# Patient Record
Sex: Male | Born: 1960 | Race: White | Hispanic: No | Marital: Married | Smoking: Never smoker
Health system: Southern US, Academic
[De-identification: ages and names within clinical notes are randomized; demographics above are authoritative.]

## PROBLEM LIST (undated history)

## (undated) DIAGNOSIS — F32A Depression, unspecified: Secondary | ICD-10-CM

## (undated) DIAGNOSIS — F419 Anxiety disorder, unspecified: Secondary | ICD-10-CM

## (undated) DIAGNOSIS — R0609 Other forms of dyspnea: Secondary | ICD-10-CM

## (undated) DIAGNOSIS — I10 Essential (primary) hypertension: Secondary | ICD-10-CM

## (undated) DIAGNOSIS — I499 Cardiac arrhythmia, unspecified: Secondary | ICD-10-CM

## (undated) DIAGNOSIS — J45909 Unspecified asthma, uncomplicated: Secondary | ICD-10-CM

## (undated) DIAGNOSIS — E7800 Pure hypercholesterolemia, unspecified: Secondary | ICD-10-CM

## (undated) DIAGNOSIS — R002 Palpitations: Secondary | ICD-10-CM

## (undated) DIAGNOSIS — D869 Sarcoidosis, unspecified: Secondary | ICD-10-CM

## (undated) DIAGNOSIS — K219 Gastro-esophageal reflux disease without esophagitis: Secondary | ICD-10-CM

## (undated) DIAGNOSIS — K5190 Ulcerative colitis, unspecified, without complications: Secondary | ICD-10-CM

## (undated) DIAGNOSIS — E119 Type 2 diabetes mellitus without complications: Secondary | ICD-10-CM

## (undated) HISTORY — PX: HX VASECTOMY: SHX75

## (undated) HISTORY — PX: UMBILICAL HERNIA REPAIR: SHX196

## (undated) HISTORY — PX: HX KNEE ARTHROSCOPY: SHX127

## (undated) HISTORY — PX: HX HERNIA REPAIR: SHX51

## (undated) HISTORY — DX: Palpitations: R00.2

## (undated) HISTORY — DX: Cardiac arrhythmia, unspecified: I49.9

## (undated) HISTORY — DX: Other forms of dyspnea: R06.09

## (undated) HISTORY — PX: COLONOSCOPY: WVUENDOPRO10

---

## 1993-04-03 ENCOUNTER — Other Ambulatory Visit (HOSPITAL_COMMUNITY): Payer: Self-pay

## 2020-10-14 IMAGING — CT CT THORAX  W/O CONTRAST
2 of 4 series · 15 of 36 positions shown, 18 images · non-contrast
Comparison: No prior imaging studies are available for comparison of the chest.

﻿EXAM:  90321   CT THORAX  W/O CONTRAST
INDICATION: 60-year-old with symptoms of shortness of breath since having Covid infection in May 2020.  Diagnosis of sarcoidosis.  Smoking history not available.
TECHNIQUE: Multislice CT was performed without contrast and reviewed in multiple projections.  Exam was performed using one or more of the following dose reduction techniques: Automated exposure control, adjustment of the mA and/or kV according to patient size, or the use of iterative reconstruction technique. Radiation dose 679 mGy cm.

[ax · axial · 0.88mm/px · z∈[-754,-490]mm · 12 of 155 slices shown, 15 images]
[im 12/155  mediastinal]
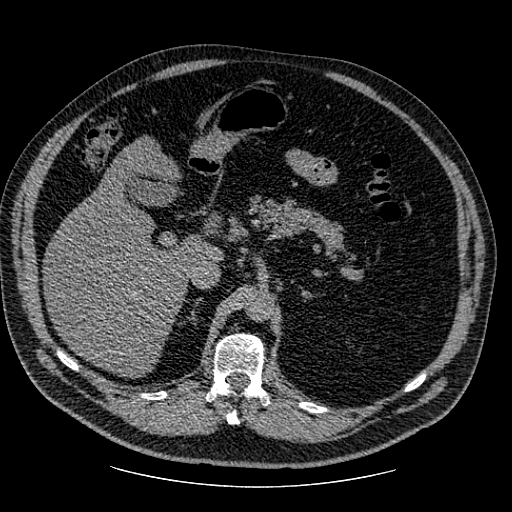
[im 12/155  lung]
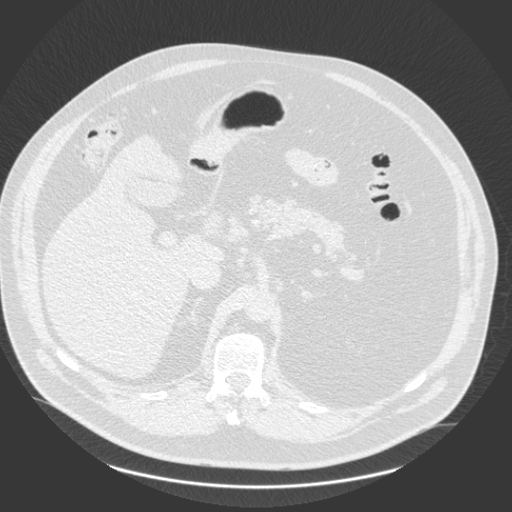
[im 23/155  lung]
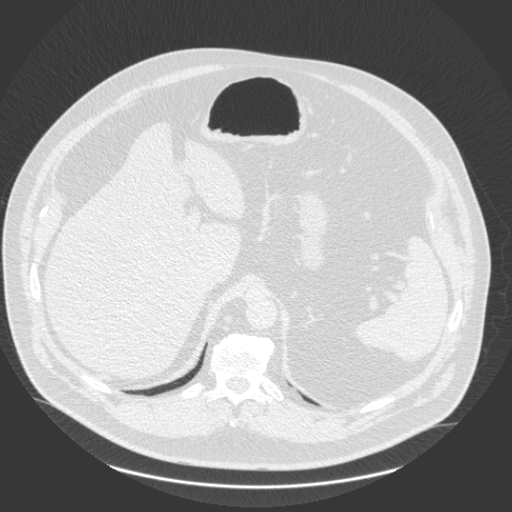
[im 34/155  lung]
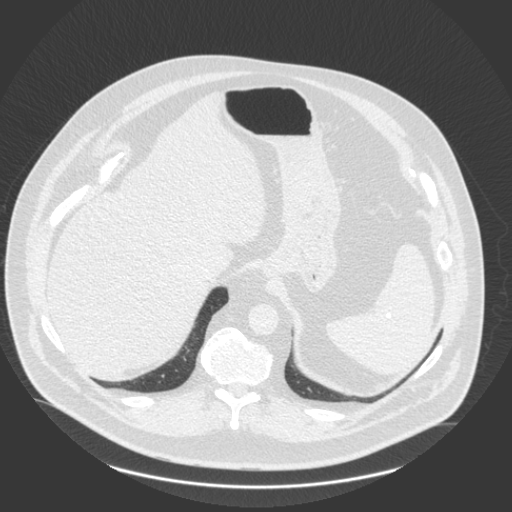
[im 45/155  lung]
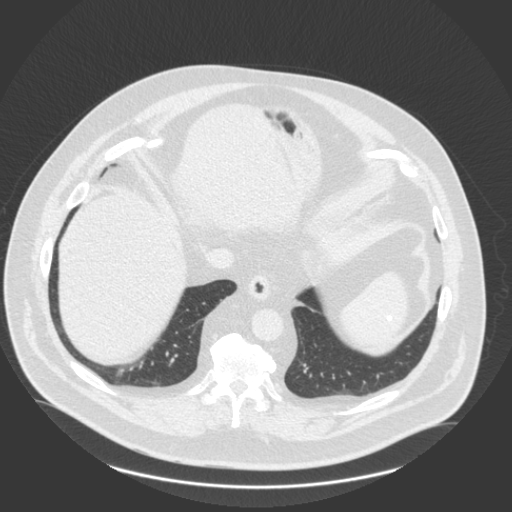
[im 56/155  mediastinal]
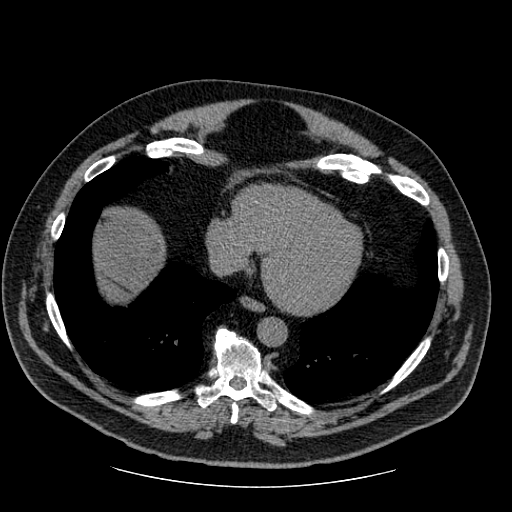
[im 56/155  lung]
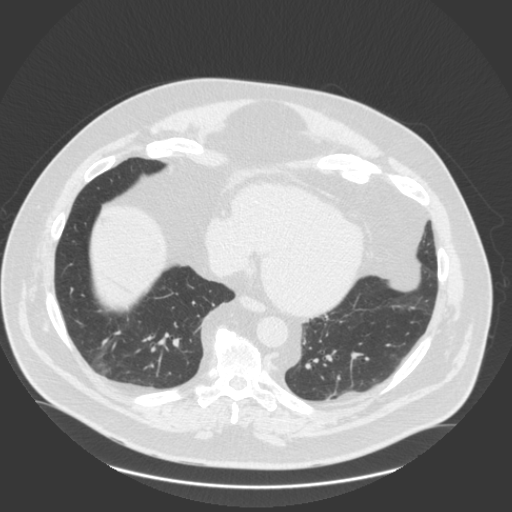
[im 67/155  lung]
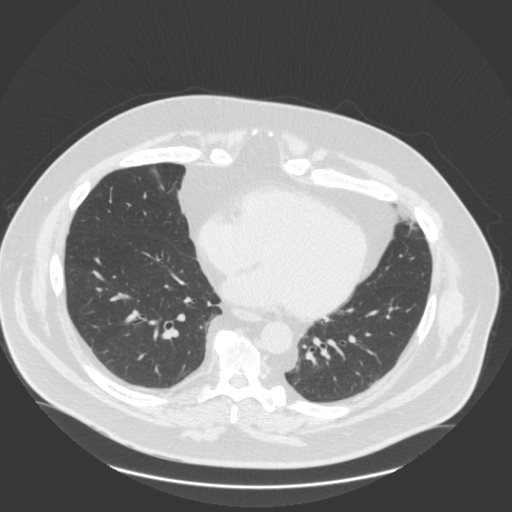
[im 89/155  lung]
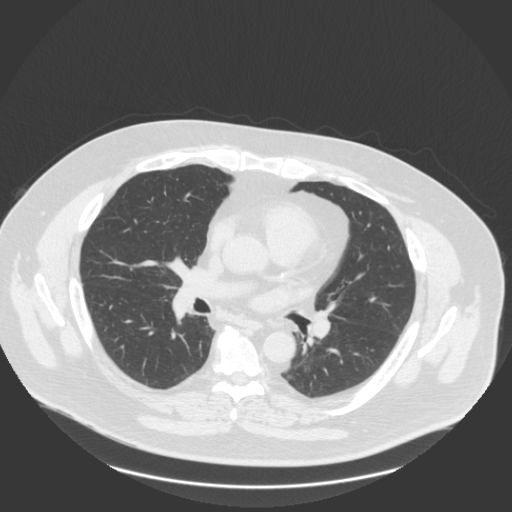
[im 100/155  lung]
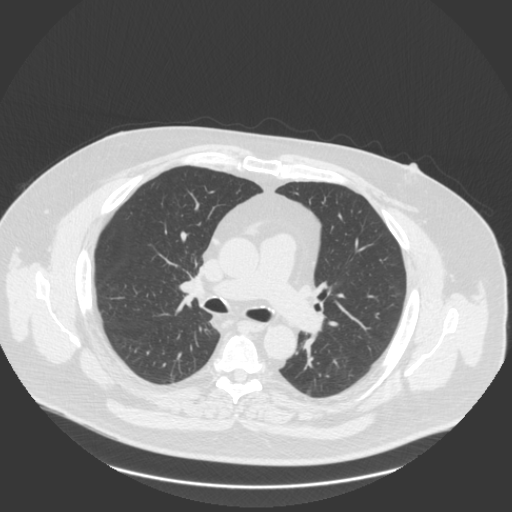
[im 111/155  mediastinal]
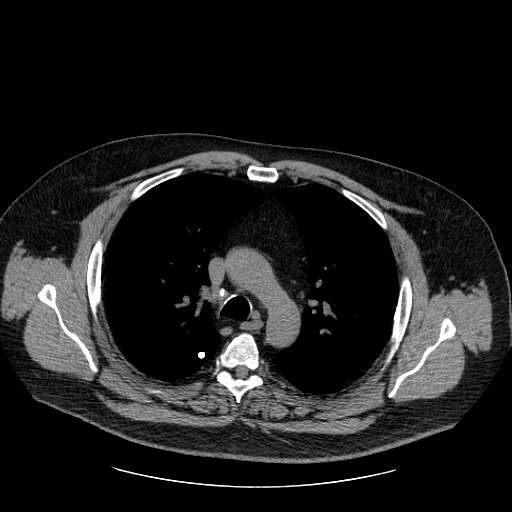
[im 111/155  lung]
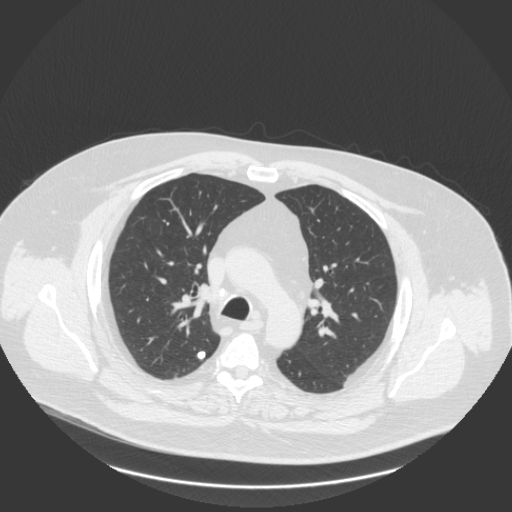
[im 122/155  lung]
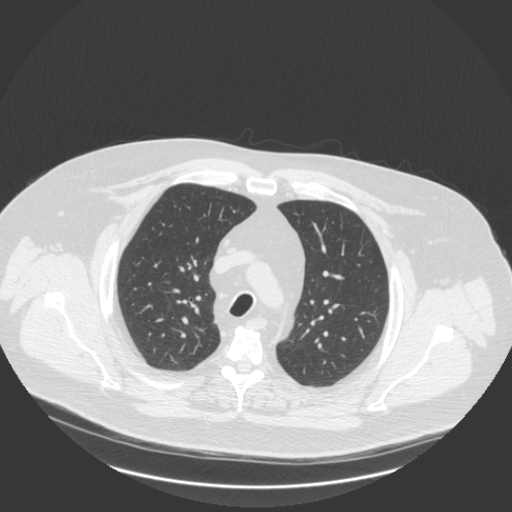
[im 133/155  lung]
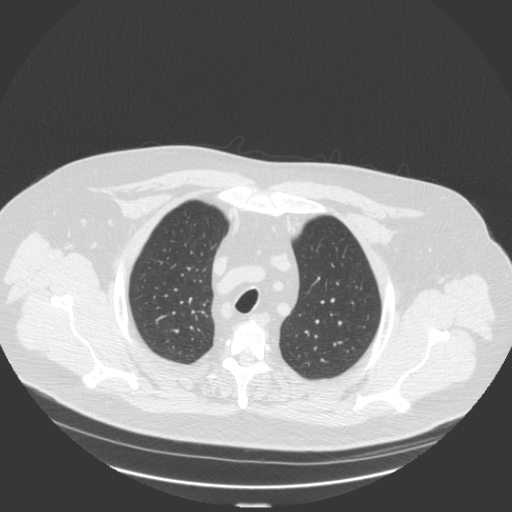
[im 144/155  lung]
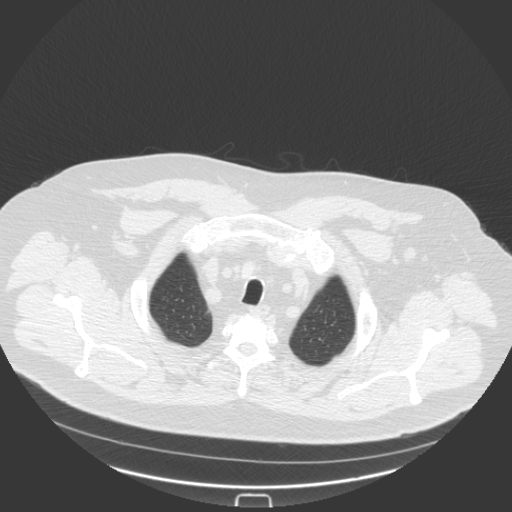

[cor · coronal · 0.95mm/px · 3 of 138 slices shown]
[im 28/138  lung]
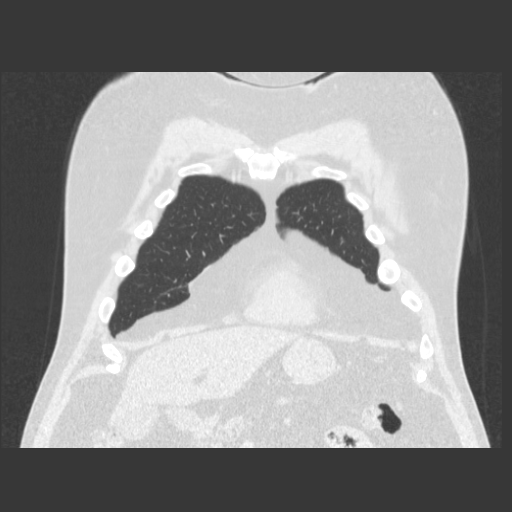
[im 55/138  lung]
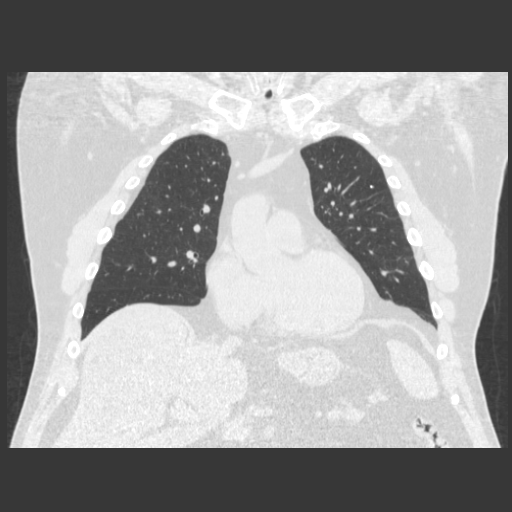
[im 83/138  lung]
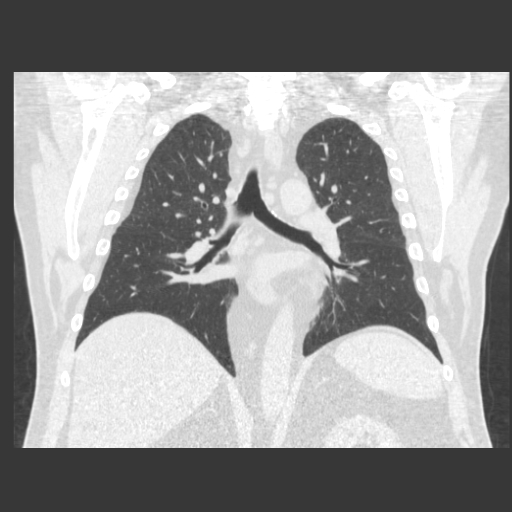

[15 of 36 positions shown; findings below may reference images not displayed]

FINDINGS: No focal lesions of supraclavicular fossa and axillae.  A 9 mm benign calcified granuloma in the right upper lobe posterior segment with calcified lymph nodes of right hilum and precarinal mediastinum.  No other pulmonary mass or nodule is seen.  No significant residual inflammatory changes or postinflammatory changes of the lung fields are seen.  Small benign granuloma is also noted in the left upper lobe.  

No pleural or pericardial effusion.  No focal lesions of thoracic spine, sternum and ribs.
IMPRESSION: 1. No significant postinflammatory interstitial changes of the lungs are seen. 

2. Benign calcified granulomatous lesions of both lungs with calcified lymph nodes of right hilum and precarinal mediastinum.  

3. No pleural or pericardial effusion.  Small hiatus hernia.

## 2021-06-06 IMAGING — MR MRI KNEE LT W/O CONTRAST
5 series · 35 of 40 positions shown · IV contrast (gadolinium)
Comparison: None available.

﻿EXAM:  [id number]   MRI KNEE LT W/O CONTRAST
INDICATION: Pain for several months following injury.
TECHNIQUE: Multiplanar multisequential MRI of the left knee joint was performed without gadolinium contrast.

[Series 5: PD fat-sat · axial · left · 4.0mm · 0.53mm/px · z∈[-50,+80]mm · 8 of 30 slices shown (1 of 3)]
[im 1/30]
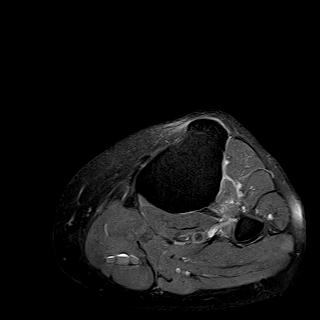
[im 5/30]
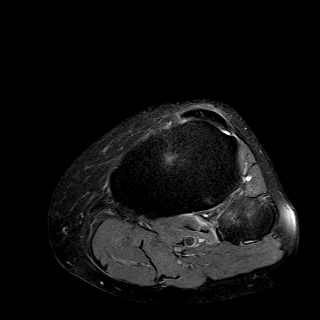
[im 9/30]
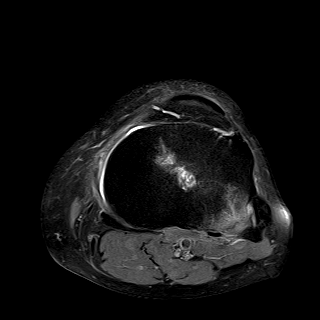
[im 13/30]
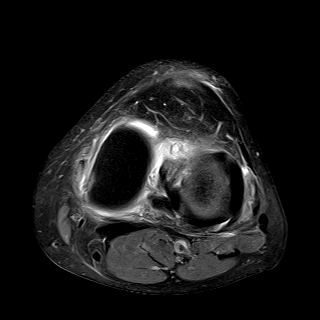
[im 17/30]
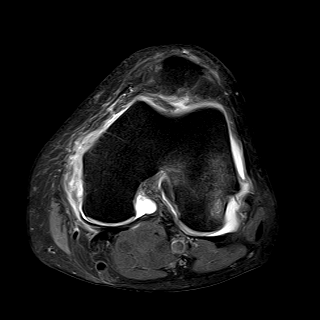
[im 21/30]
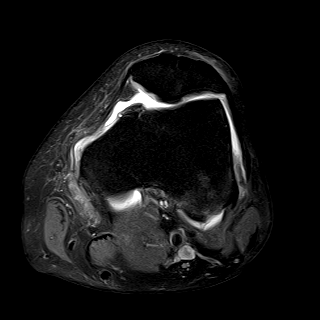
[im 25/30]
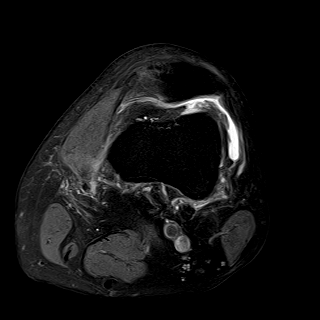
[im 30/30]
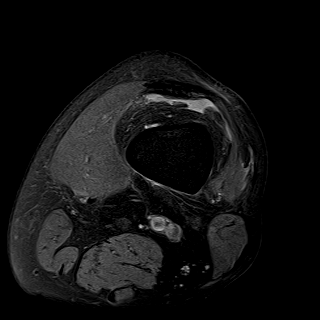

[Series 6: PD fat-sat · sagittal · left · 3.0mm · 0.47mm/px · 8 of 30 slices shown (2 of 3)]
[im 1/30]
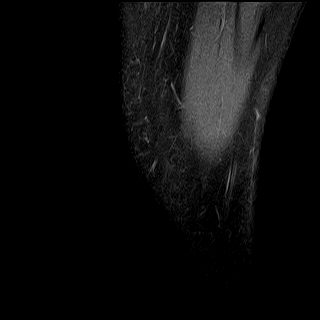
[im 5/30]
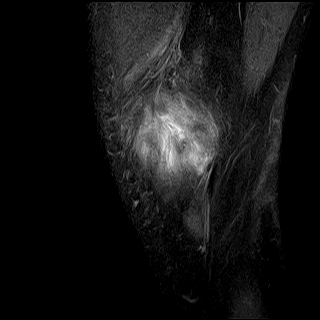
[im 9/30]
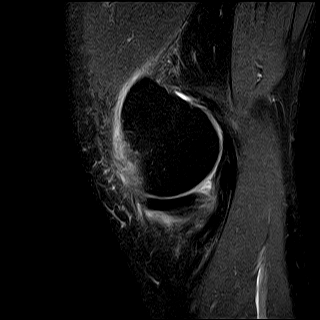
[im 13/30]
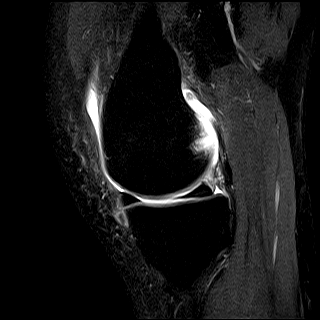
[im 17/30]
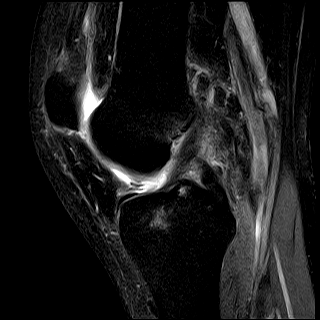
[im 21/30]
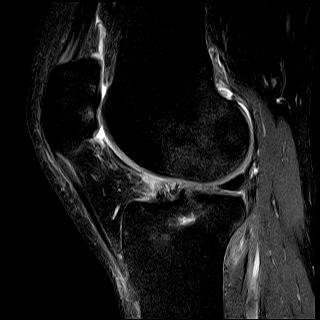
[im 25/30]
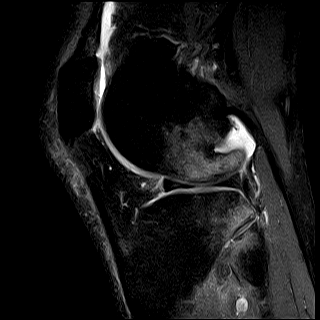
[im 30/30]
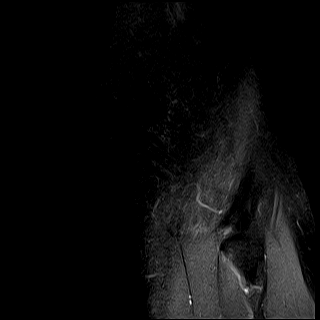

[Series 7: T1 · sagittal · left · 3.0mm · 0.29mm/px · 8 of 30 slices shown]
[im 1/30]
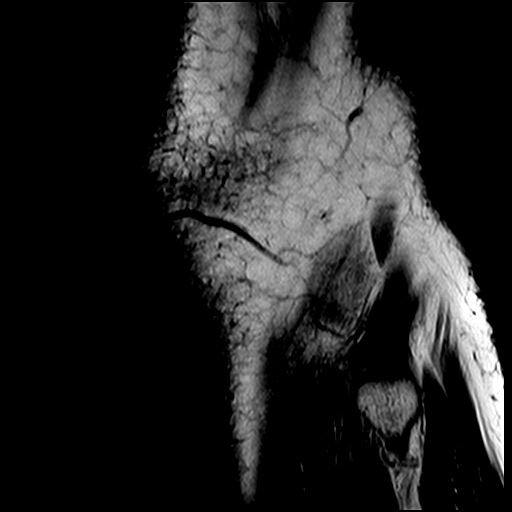
[im 5/30]
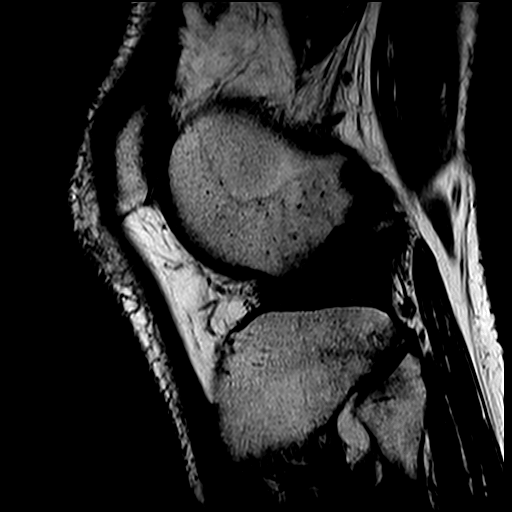
[im 9/30]
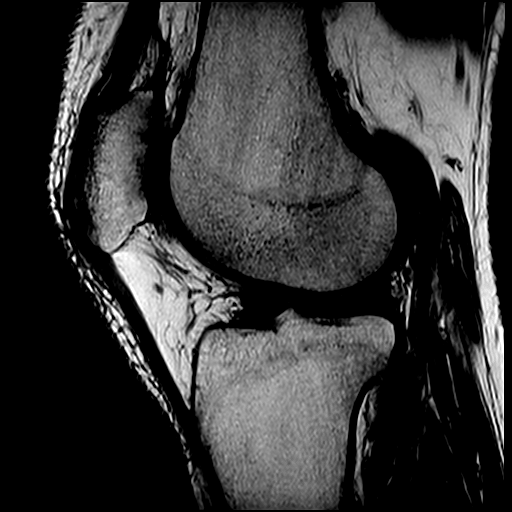
[im 13/30]
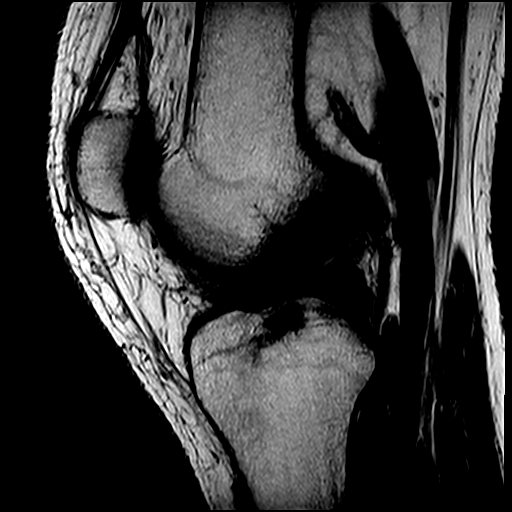
[im 17/30]
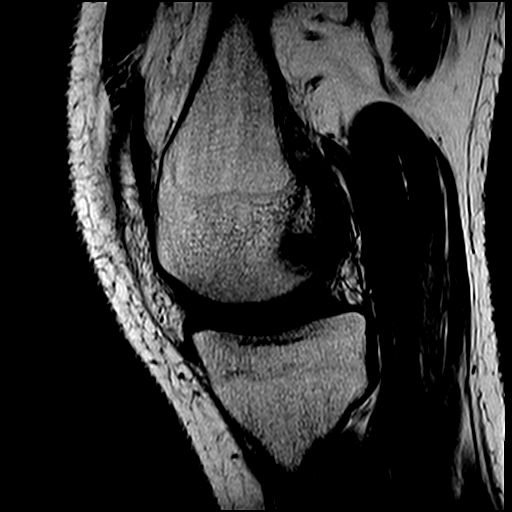
[im 21/30]
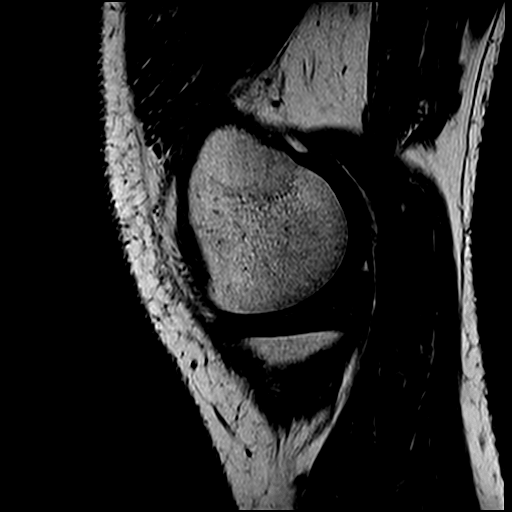
[im 25/30]
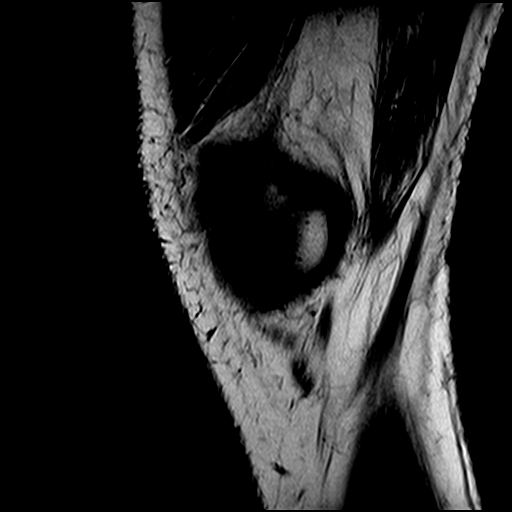
[im 30/30]
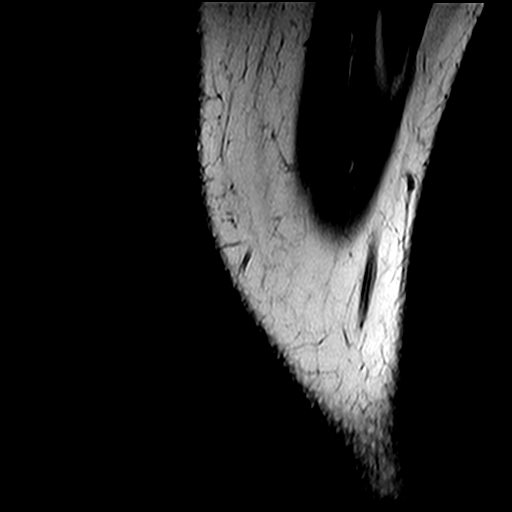

[Series 9: PD fat-sat · coronal · left · 3.5mm · 0.47mm/px · 8 of 27 slices shown (3 of 3)]
[im 1/27]
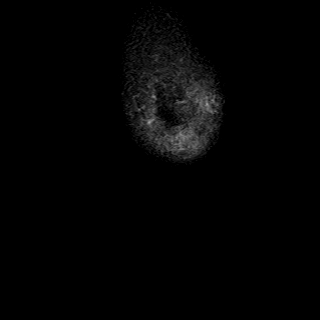
[im 4/27]
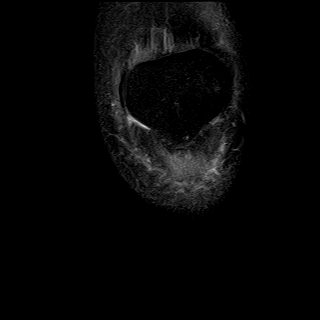
[im 8/27]
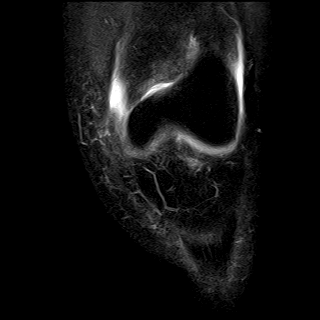
[im 12/27]
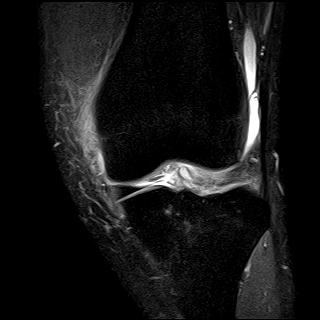
[im 15/27]
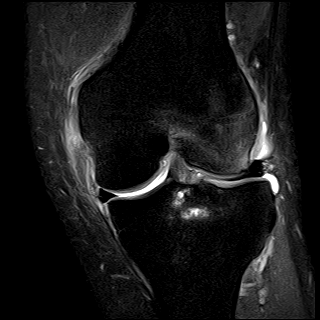
[im 19/27]
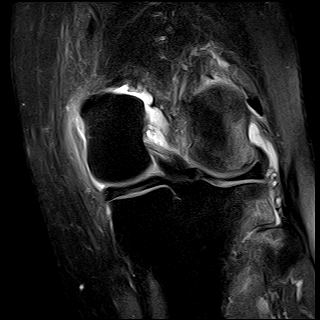
[im 23/27]
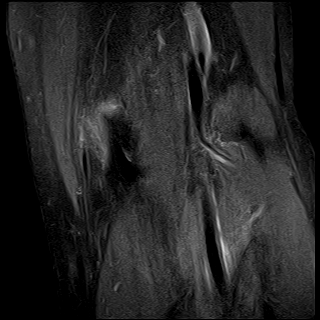
[im 27/27]
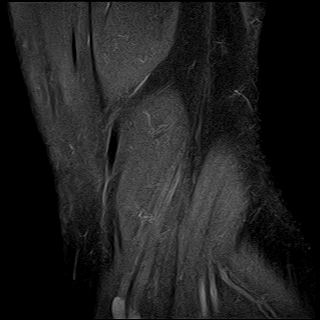

[Series 15: cor (id) · coronal · left · 3.5mm · 0.47mm/px · 3 of 27 slices shown]
[im 1/27]
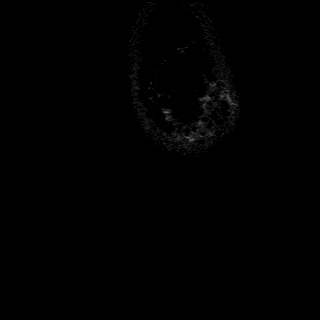
[im 4/27]
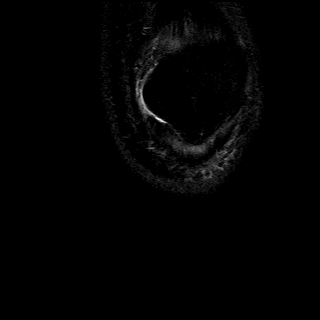
[im 8/27]
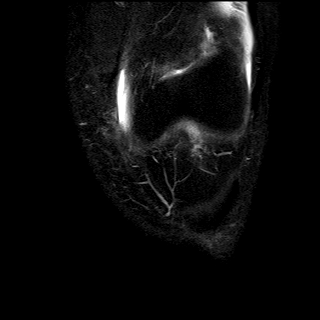

[35 of 40 positions shown; findings below may reference images not displayed]

FINDINGS: There is a full-thickness tear involving the proximal medial collateral ligament.  Menisci, cruciate and lateral collateral ligament complex appear intact.  There is moderate subcortical edema within the lateral femoral condyle, posterolateral tibial plateau and fibular head.  There is grade 3 chondromalacia of the medial tibiofemoral and patellofemoral articulations.  Mild subcortical cystic changes are also noted underlying the tibial spine.  Extensor mechanism is intact.  There is a small suprapatellar effusion. There is no Baker's cyst.
IMPRESSION: 1. Full-thickness tear involving the proximal medial collateral ligament. 

2. Moderate subcortical edema within the lateral femoral condyle, posterolateral tibial plateau and fibular head.  A posterolateral corner injury cannot be entirely excluded.  

3. Grade 3 chondromalacia of the medial tibiofemoral and patellofemoral articulations.

## 2021-07-05 ENCOUNTER — Encounter (INDEPENDENT_AMBULATORY_CARE_PROVIDER_SITE_OTHER): Payer: Self-pay | Admitting: NURSE PRACTITIONER

## 2021-10-05 ENCOUNTER — Other Ambulatory Visit (HOSPITAL_COMMUNITY): Payer: Self-pay | Admitting: Orthopaedic Surgery

## 2021-10-05 ENCOUNTER — Other Ambulatory Visit (HOSPITAL_COMMUNITY): Payer: 59

## 2021-10-05 ENCOUNTER — Other Ambulatory Visit: Payer: Self-pay

## 2021-10-05 ENCOUNTER — Inpatient Hospital Stay
Admission: RE | Admit: 2021-10-05 | Discharge: 2021-10-05 | Disposition: A | Discharge status: Home / Self Care | Payer: 59 | Source: Ambulatory Visit | Attending: Orthopaedic Surgery | Admitting: Orthopaedic Surgery

## 2021-10-05 DIAGNOSIS — Z01818 Encounter for other preprocedural examination: Secondary | ICD-10-CM

## 2021-10-05 LAB — BASIC METABOLIC PANEL
ANION GAP: 7 mmol/L — ABNORMAL LOW (ref 10–20)
BUN/CREA RATIO: 15 (ref 6–22)
BUN: 17 mg/dL (ref 7–25)
CALCIUM: 10.3 mg/dL (ref 8.6–10.3)
CHLORIDE: 105 mmol/L (ref 98–107)
CO2 TOTAL: 26 mmol/L (ref 21–31)
CREATININE: 1.15 mg/dL (ref 0.60–1.30)
ESTIMATED GFR: 72 mL/min/{1.73_m2} (ref >59)
GLUCOSE: 134 mg/dL — ABNORMAL HIGH (ref 74–109)
OSMOLALITY, CALCULATED: 279 mOsm/kg (ref 270–290)
POTASSIUM: 4.7 mmol/L (ref 3.5–5.1)
SODIUM: 138 mmol/L (ref 136–145)

## 2021-10-05 LAB — CBC
HCT: 44.3 % (ref 42.0–51.0)
HGB: 15.2 g/dL (ref 13.5–18.0)
MCH: 31.4 pg (ref 27.0–32.0)
MCHC: 34.3 g/dL (ref 32.0–36.0)
MCV: 91.5 fL (ref 78.0–99.0)
MPV: 7.9 fL (ref 7.4–10.4)
PLATELETS: 227 10*3/uL (ref 140–440)
RBC: 4.84 10*6/uL (ref 4.20–6.00)
RDW: 14.2 % (ref 11.6–14.8)
WBC: 10.2 10*3/uL (ref 4.0–10.5)
WBCS UNCORRECTED: 10.2 10*3/uL

## 2021-10-05 LAB — URINALYSIS, MACROSCOPIC
BILIRUBIN: NEGATIVE mg/dL
BLOOD: NEGATIVE mg/dL
GLUCOSE: 50 mg/dL — AB
KETONES: NEGATIVE mg/dL
LEUKOCYTES: NEGATIVE WBCs/uL
NITRITE: NEGATIVE
PH: 6 (ref 5.0–9.0)
PROTEIN: 50 mg/dL — AB
SPECIFIC GRAVITY: 1.023 (ref 1.002–1.030)
UROBILINOGEN: NORMAL mg/dL

## 2021-10-05 LAB — URINALYSIS, MICROSCOPIC

## 2021-10-06 LAB — ECG 12 LEAD
Atrial Rate: 67 {beats}/min
Calculated P Axis: 17 degrees
Calculated R Axis: 47 degrees
Calculated T Axis: 38 degrees
PR Interval: 146 ms
QRS Duration: 90 ms
QT Interval: 402 ms
QTC Calculation: 424 ms
Ventricular rate: 67 {beats}/min

## 2021-10-08 NOTE — H&P (Signed)
Scott Bradshaw  [id number]      CHIEF COMPLAINT  LEFT Knee pain; 08/07/21; GS / PJB    HISTORY  61 year old male suffered left knee injury on 05/16/2021. He states that bull pinned him up against a gate and started raking his head up and down him while doing so injured the left knee. patient has remained fairly active on the knee. Has not noted much improvement of the knee symptoms.He had corticosteroid injection with improved symptoms for approximately 1 week. Still has medial knee pain that is interfering with activities    PAST MEDICAL HISTORY  Cancer: Skin melanoma no known metastasis, Stage Removed Years Ago Treatment: By: C43.9: Malignant melanoma of skin, unspecified Cardiac: Hypertension and Hyperlipidemia DM type 2, W/ insulin use w/o complication DM Treatment: Monitoring, PCP, Trelegy 256mcg Tresiba 100 once daily. E11: Type 2 diabetes mellitus Z79.4: Long term (current) use of insulin GI history: GERD w/o esophagitis and Ulcerative colitis Treatment: N/A By: Ulcerative Coloitis resolved years ago. K51.9: Ulcerative colitis, unspecified Musculoskeletal: Denies OA, gout and other common musculoskeletal problems. Rheumatologic: Denies Depression, Major depression, single,ModerateTX:MonitoringTxby:PCPRobin Bradshaw PCP AnxietyRobin Bradshaw, PCPActiveDx:Mod:01/13/23ICD9: N/A Pulmonary: Asthma, COPD, Sarcoidosis and Sleep Apnea Sleep apnea treatment: CPAP    PAST SURGICAL HISTORY  Date Side/ Site Procedure Details Doctor Facility Complications [id number] Umbilical hernia surgery 05/07/13 Right Lower extremity-Knee 05/07/96 Right Lower extremity-Knee Knee arthroscopy Knee arthroscopy    ALLERGIES  Name Reactions Notes Penicillins Hives    MEDICATIONS  Name Ultracet 325-37.5 mg 30 One or two Oral every 6-8 hours as needed 2021-05-22 ------INTERACTIONS: Drug-drug interaction to Viibryd; Singulair 10 mg 1 One tab po once daily. 2021-05-19 ------Asthma Therapy - Leukotriene Receptor Antagonists Lipitor 20 mg 1 One tab po once  daily. 2021-05-19 ------Antihyperlipidemic - HMG CoA Reductase Inhibitors (statins) Glucophage 1,000 mg 1 One tab po BID. 2021-05-19 ------Insulin Response Enhancers - Biguanides K-Dur 20 mEq 1 One tab po once daily. 2021-05-19 ------Minerals & Electrolytes - Potassium, Oral Viibryd 40 mg 1 One tab po once daily. 2021-05-19 ------Antidepressant - SSRI & 5HT1A Partial Agonist Lasix 40 mg 1 One tab po once daily. 2021-05-19 ------Diuretic - Loop Tenormin 25 mg 1 One tab po once daily. 2021-05-19 ------Beta Blockers Cardiac Selective Claritin 10 mg 1 One tab po once daily. 2021-05-19 ------Antihistamines - 2nd Generation Vistaril 25 mg 1 One tab po once daily PRN 2021-05-19 ------Antianxiety Agent - Antihistamine Type Acuprin 81 mg 1 One tab po once daily. 2021-05-19 ------Platelet Aggregation Inhibitors - Salicylates ------INTERACTIONS: Drug-drug interaction to Viibryd; Klonopin 0.5 mg 1 One tab po once daily. PRN 2021-05-19 ------Anticonvulsant - Benzodiazepines D3 DOTS 2,000 unit 1 One tab po once daily. 2021-05-19 ------Vitamins - D Derivatives ProAir HFA 90 mcg/actuation 1 Two puff q4h PRN 2021-05-19 ------Asthma/COPD Therapy - Beta 2-Adrenergic Agents, Inhaled, Short Acting AccuNeb 1.25 mg/3 mL 1 One vial inhalation via nebulizer q4h PRN 2021-05-19 ------Asthma/COPD Therapy - Beta 2-Adrenergic Agents, Inhaled, Short Acting AndroGel 1.62 % (40.5 mg/2.5 1 Apply as directed two pumps a day BID 2021-05-19 ------Androgen - Single Agents 2 Strength QTY Sig Date  Action B-50 1 1 One tab po once daily. 2021-05-19 ------B-Complex Vitamin Combinations Nexium 40 mg 1 One tab po once daily. 2021-05-19 ------Gastric Acid Secretion Reducing Agents - Proton Pump Inhibitors (PPIs)    Family and Social History  Patient is Married. Non-smoker Recode: 4 Smokeless 1 can/day X years ETOH: <2oz/ week Lives with family Mother deceased age 12;Bone Cancer Father: age 69 No brothers; 1 sisters.; MI in father Cholesterol father  Other,  mother, Thyroid No known family history of Diabetes, Hypertension, Stroke, Bleeding, Blood clots, Neurologic conditions    REVIEW OF SYSTEMS  General: No history of change in weight, fever, chills, change in energy, fatigue or changes in sleep habits. Opthalmic: Negative for change in vision / eye problems. ENT: Denies congestion, sinus, throat and hearing issues. Respiratory: Denies SOB, cough and other respiratory symptoms. Cardiac: Denies chest pain, CHF and other cardiac symptoms. GI: No changes in bowel habits, abdominal pain or other GI complaint. Male GU: Denies significant male genitourinary symptoms. Musculoskeletal: Denies significant pain, joint or muscular symptoms. Neurological: Denies cephalgia, seizures, weakness, numbness and other neurologic symptoms. Endocrine: Denies polyuria, polydypsia and other endocrine related complaints. Diabetes, Hg A1C level: 8.9% which represents a mean glucose in the range of fair control with mean glucose in the range of 150-215. Scott Bradshaw, PCP Behavioral: Denies significant mood and psychological symptoms. Hemeatologic: Denies anemia, bleeding / clotting problems and other blood related complaints. Patient has history of diagnosed OSA RCRI, no risk factors, risk of cardiac death, nonfatal MI and cardiac arrest, 0.4%. Risk of MI, Pulmonary edema, ventricular fib, cardiac arrest and complete heart block 0.5% POUR / IPSS score 0. Score classified as mild level of symptoms, with no or mild increase in risk of urinary retention. ASA II : Mild systemic disease Charlson score: Enter [place] then select calculate score resulting in estimated in hospital mortality rate of 1.2%    EXAM  left knee no effusion mild quad atrophy pain medial joint line Murray's test positive compartment increased anterior drawer patellofemoral crepitation noted patellofemoral compression is negative range of motion is full; General exam   BMI   Cardiac, respiratory and mental status  NORMAL    X-RAYS  Date Name Review 06/06/2021 L Knee MR Full-thickness tear involving the proximal medial collateral ligament. moderate subcortical edema within the lateral femoral condyle, posterolateral ibial plateau and fibular head. a posterolateral corner injury cannot be entirley excluded. Grade 3 chondromalacia of hte medial tibiofemoral and patellaformoral articulatins. Review of outside study &   report  05/19/2021 Bil KN AP L Lat Stand 2VL Left Knee Primary arthritis, KL stage 2- mild. Compartments: lateral. Minimal lateral joint space narrowing.    Other Treatments reviewed  Date Name Review  ASSESSMENT  left knee MCL sprain bone edema lateral femoral condyle lateral tibial plateau medial meniscal tear ACL injury    Diagnosis  [place] Diagnosis C43.9: Malignant melanoma of skin, unspecified E11: Type 2 diabetes mellitus Z79.4: Long term (current) use of insulin K51.9: Ulcerative colitis, unspecified F32.1: Major depressive disorder, single episode, moderate  left knee MCL sprain bone edema lateral femoral condyle lateral tibial plateau medial meniscal tear ACL injury    RECOMMENDATION  Treatment: we again today reviewed MRI of the knee which shows medial meniscal tear some abnormality of the ACL as well as some osteoarthritis patellofemoral joint medial compartment. This point its been 3 months since the injury still having medial knee pain interfering with activities since had failure of corticosteroid injection. We discussed the role of arthroscopy. At this point his bone edema should have resolved. Probably has ACL tear. He does have medial meniscus tear as well as some early arthritis of the knee. It was made clear that arthroscopic surgery would not i mprove symptoms associated with osteoarthritis. At 61 years of age would not recommend doing ACL reconstruction. We discussed procedure discussed risk factors neurovascular compromise and infection. Surgical informed consent was obtained    Attestation:  Patient has been examined before anesthesia, and there are no changes in medical status or indications for surgery.      Scott Bradshaw is a 61 y.o. male brought by   Orders:   Date Name Review 06/06/2021 L Knee MR Full-thickness tear involving the proximal medial collateral ligament. moderate subcortical edema within the lateral femoral condyle, posterolateral ibial plateau and fibular head. a posterolateral corner injury cannot be entirley excluded. Grade 3 chondromalacia of hte medial tibiofemoral and patellaformoral articulatins. Review of outside study & report 05/19/2021 Bil KN AP L Lat Stand 2VL Left Knee Primary arthritis, KL stage 2- mild. Compartments: lateral. Minimal lateral joint space narrowing.  Date Name Review 05/19/2021 Left knee brace N/A    RETURN / FOLLOW-UP  proceed with scheduling arthroscopy    Karsten Ro, MD\ Talbert Forest, PA-C

## 2021-10-09 ENCOUNTER — Encounter (HOSPITAL_COMMUNITY): Payer: Self-pay | Admitting: Orthopaedic Surgery

## 2021-10-09 ENCOUNTER — Ambulatory Visit (HOSPITAL_COMMUNITY): Payer: 59

## 2021-10-09 ENCOUNTER — Encounter (HOSPITAL_COMMUNITY)
Admission: RE | Disposition: A | Discharge status: Home / Self Care | Payer: Self-pay | Source: Ambulatory Visit | Attending: Orthopaedic Surgery

## 2021-10-09 ENCOUNTER — Encounter (HOSPITAL_COMMUNITY): Payer: 59 | Admitting: Orthopaedic Surgery

## 2021-10-09 ENCOUNTER — Other Ambulatory Visit: Payer: Self-pay

## 2021-10-09 ENCOUNTER — Inpatient Hospital Stay
Admission: RE | Admit: 2021-10-09 | Discharge: 2021-10-09 | Disposition: A | Discharge status: Home / Self Care | Payer: 59 | Source: Ambulatory Visit | Attending: Orthopaedic Surgery | Admitting: Orthopaedic Surgery

## 2021-10-09 DIAGNOSIS — S83282A Other tear of lateral meniscus, current injury, left knee, initial encounter: Secondary | ICD-10-CM | POA: Insufficient documentation

## 2021-10-09 DIAGNOSIS — S83512A Sprain of anterior cruciate ligament of left knee, initial encounter: Secondary | ICD-10-CM | POA: Insufficient documentation

## 2021-10-09 DIAGNOSIS — S83242A Other tear of medial meniscus, current injury, left knee, initial encounter: Secondary | ICD-10-CM | POA: Insufficient documentation

## 2021-10-09 HISTORY — DX: Pure hypercholesterolemia, unspecified: E78.00

## 2021-10-09 HISTORY — DX: Unspecified asthma, uncomplicated: J45.909

## 2021-10-09 HISTORY — DX: Type 2 diabetes mellitus without complications (CMS HCC): E11.9

## 2021-10-09 HISTORY — DX: Gastro-esophageal reflux disease without esophagitis: K21.9

## 2021-10-09 HISTORY — DX: Sarcoidosis, unspecified: D86.9

## 2021-10-09 HISTORY — DX: Anxiety disorder, unspecified: F41.9

## 2021-10-09 HISTORY — DX: Essential (primary) hypertension: I10

## 2021-10-09 HISTORY — DX: Depression, unspecified: F32.A

## 2021-10-09 HISTORY — DX: Ulcerative colitis, unspecified, without complications (CMS HCC): K51.90

## 2021-10-09 LAB — POC BLOOD GLUCOSE (RESULTS)
GLUCOSE, POC: 124 mg/dl (ref 50–500)
GLUCOSE, POC: 140 mg/dl (ref 50–500)

## 2021-10-09 SURGERY — ARTHROSCOPY KNEE
Anesthesia: General | Site: Knee | Laterality: Left | Wound class: Clean Wound: Uninfected operative wounds in which no inflammation occurred

## 2021-10-09 MED ORDER — HYDROCODONE 5 MG-ACETAMINOPHEN 325 MG TABLET
1.0000 | ORAL_TABLET | ORAL | Status: DC | PRN
Start: 2021-10-09 — End: 2021-10-09
  Administered 2021-10-09: 1 via ORAL
  Filled 2021-10-09: qty 1

## 2021-10-09 MED ORDER — ALBUTEROL SULFATE HFA 90 MCG/ACTUATION AEROSOL INHALER
INHALATION_SPRAY | Freq: Once | RESPIRATORY_TRACT | Status: DC | PRN
Start: 2021-10-09 — End: 2021-10-09
  Administered 2021-10-09 (×2): 4 via RESPIRATORY_TRACT

## 2021-10-09 MED ORDER — FENTANYL (PF) 50 MCG/ML INJECTION WRAPPER
50.0000 ug | INJECTION | INTRAMUSCULAR | Status: DC | PRN
Start: 2021-10-09 — End: 2021-10-09
  Administered 2021-10-09 (×2): 50 ug via INTRAVENOUS

## 2021-10-09 MED ORDER — SODIUM CHLORIDE 0.9 % INTRAVENOUS PIGGYBACK
INJECTION | INTRAVENOUS | Status: AC
Start: 2021-10-09 — End: 2021-10-09
  Filled 2021-10-09: qty 100

## 2021-10-09 MED ORDER — HYDROCODONE 5 MG-ACETAMINOPHEN 325 MG TABLET
1.00 | ORAL_TABLET | ORAL | 0 refills | Status: AC | PRN
Start: 2021-10-09 — End: ?

## 2021-10-09 MED ORDER — ROPIVACAINE (PF) 2 MG/ML (0.2 %) INJECTION SOLUTION
INTRAMUSCULAR | Status: AC
Start: 2021-10-09 — End: 2021-10-09
  Filled 2021-10-09: qty 10

## 2021-10-09 MED ORDER — SODIUM CHLORIDE 0.9 % (FLUSH) INJECTION SYRINGE
3.0000 mL | INJECTION | Freq: Three times a day (TID) | INTRAMUSCULAR | Status: DC
Start: 2021-10-09 — End: 2021-10-09

## 2021-10-09 MED ORDER — LACTATED RINGERS INTRAVENOUS SOLUTION
INTRAVENOUS | Status: DC
Start: 2021-10-09 — End: 2021-10-09

## 2021-10-09 MED ORDER — MORPHINE 10 MG/ML INJECTION WRAPPER
INTRAVENOUS | Status: AC
Start: 2021-10-09 — End: 2021-10-09
  Filled 2021-10-09: qty 1

## 2021-10-09 MED ORDER — MIDAZOLAM 5 MG/ML INJECTION WRAPPER
INTRAMUSCULAR | Status: AC
Start: 2021-10-09 — End: 2021-10-09
  Filled 2021-10-09: qty 1

## 2021-10-09 MED ORDER — CEFAZOLIN 1 GRAM SOLUTION FOR INJECTION
INTRAMUSCULAR | Status: AC
Start: 2021-10-09 — End: 2021-10-09
  Filled 2021-10-09: qty 20

## 2021-10-09 MED ORDER — ONDANSETRON HCL (PF) 4 MG/2 ML INJECTION SOLUTION
4.0000 mg | Freq: Once | INTRAMUSCULAR | Status: DC | PRN
Start: 2021-10-09 — End: 2021-10-09

## 2021-10-09 MED ORDER — FENTANYL (PF) 50 MCG/ML INJECTION SOLUTION
INTRAMUSCULAR | Status: AC
Start: 2021-10-09 — End: 2021-10-09
  Filled 2021-10-09: qty 2

## 2021-10-09 MED ORDER — ALBUTEROL SULFATE 2.5 MG/3 ML (0.083 %) SOLUTION FOR NEBULIZATION
2.5000 mg | INHALATION_SOLUTION | Freq: Once | RESPIRATORY_TRACT | Status: DC | PRN
Start: 2021-10-09 — End: 2021-10-09

## 2021-10-09 MED ORDER — FENTANYL (PF) 50 MCG/ML INJECTION WRAPPER
25.0000 ug | INJECTION | INTRAMUSCULAR | Status: DC | PRN
Start: 2021-10-09 — End: 2021-10-09

## 2021-10-09 MED ORDER — MIDAZOLAM 5 MG/ML INJECTION WRAPPER
1.0000 mg | Freq: Once | INTRAMUSCULAR | Status: DC | PRN
Start: 2021-10-09 — End: 2021-10-09
  Administered 2021-10-09: 1 mg via INTRAVENOUS

## 2021-10-09 MED ORDER — PROCHLORPERAZINE EDISYLATE 10 MG/2 ML (5 MG/ML) INJECTION SOLUTION
5.0000 mg | Freq: Once | INTRAMUSCULAR | Status: DC | PRN
Start: 2021-10-09 — End: 2021-10-09

## 2021-10-09 MED ORDER — FAMOTIDINE (PF) 20 MG/2 ML INTRAVENOUS SOLUTION
20.0000 mg | Freq: Once | INTRAVENOUS | Status: AC
Start: 2021-10-09 — End: 2021-10-09
  Administered 2021-10-09: 20 mg via INTRAVENOUS

## 2021-10-09 MED ORDER — CEFAZOLIN 1 GRAM SOLUTION FOR INJECTION
1.0000 g | Freq: Once | INTRAMUSCULAR | Status: DC
Start: 2021-10-09 — End: 2021-10-09

## 2021-10-09 MED ORDER — FENTANYL (PF) 50 MCG/ML INJECTION WRAPPER
INJECTION | Freq: Once | INTRAMUSCULAR | Status: DC | PRN
Start: 2021-10-09 — End: 2021-10-09
  Administered 2021-10-09 (×2): 50 ug via INTRAVENOUS

## 2021-10-09 MED ORDER — ONDANSETRON HCL (PF) 4 MG/2 ML INJECTION SOLUTION
4.0000 mg | Freq: Once | INTRAMUSCULAR | Status: AC
Start: 2021-10-09 — End: 2021-10-09
  Administered 2021-10-09: 4 mg via INTRAVENOUS

## 2021-10-09 MED ORDER — PROPOFOL 10 MG/ML IV BOLUS
INJECTION | Freq: Once | INTRAVENOUS | Status: DC | PRN
Start: 2021-10-09 — End: 2021-10-09
  Administered 2021-10-09: 50 mg via INTRAVENOUS
  Administered 2021-10-09: 150 mg via INTRAVENOUS

## 2021-10-09 MED ORDER — SUGAMMADEX 100 MG/ML INTRAVENOUS SOLUTION
Freq: Once | INTRAVENOUS | Status: DC | PRN
Start: 2021-10-09 — End: 2021-10-09
  Administered 2021-10-09: 400 mg via INTRAVENOUS

## 2021-10-09 MED ORDER — LIDOCAINE (PF) 100 MG/5 ML (2 %) INTRAVENOUS SYRINGE
INJECTION | Freq: Once | INTRAVENOUS | Status: DC | PRN
Start: 2021-10-09 — End: 2021-10-09
  Administered 2021-10-09: 100 mg via INTRAVENOUS

## 2021-10-09 MED ORDER — FAMOTIDINE (PF) 20 MG/2 ML INTRAVENOUS SOLUTION
INTRAVENOUS | Status: AC
Start: 2021-10-09 — End: 2021-10-09
  Filled 2021-10-09: qty 2

## 2021-10-09 MED ORDER — ONDANSETRON HCL (PF) 4 MG/2 ML INJECTION SOLUTION
INTRAMUSCULAR | Status: AC
Start: 2021-10-09 — End: 2021-10-09
  Filled 2021-10-09: qty 2

## 2021-10-09 MED ORDER — IPRATROPIUM 0.5 MG-ALBUTEROL 3 MG (2.5 MG BASE)/3 ML NEBULIZATION SOLN
3.0000 mL | INHALATION_SOLUTION | Freq: Once | RESPIRATORY_TRACT | Status: DC | PRN
Start: 2021-10-09 — End: 2021-10-09

## 2021-10-09 MED ORDER — LACTATED RINGERS INTRAVENOUS SOLUTION
INTRAVENOUS | Status: DC
Start: 2021-10-09 — End: 2021-10-09
  Administered 2021-10-09: 0 via INTRAVENOUS

## 2021-10-09 MED ORDER — SODIUM CHLORIDE 0.9 % (FLUSH) INJECTION SYRINGE
3.0000 mL | INJECTION | INTRAMUSCULAR | Status: DC | PRN
Start: 2021-10-09 — End: 2021-10-09

## 2021-10-09 MED ORDER — SUCCINYLCHOLINE 20 MG/ML INTRAVENOUS WRAPPER
INJECTION | Freq: Once | INTRAVENOUS | Status: DC | PRN
Start: 2021-10-09 — End: 2021-10-09
  Administered 2021-10-09: 160 mg via INTRAVENOUS

## 2021-10-09 MED ORDER — ROCURONIUM 10 MG/ML INTRAVENOUS SYRINGE WRAPPER
INJECTION | Freq: Once | INTRAVENOUS | Status: DC | PRN
Start: 2021-10-09 — End: 2021-10-09
  Administered 2021-10-09: 10 mg via INTRAVENOUS
  Administered 2021-10-09: 40 mg via INTRAVENOUS

## 2021-10-09 SURGICAL SUPPLY — 62 items
BAG BIOHAZ RD 30X24IN THK3 MIL C8-10GL LLDPE INFCT WASTE CAN (MED SURG SUPPLIES) ×2
BAG BIOHAZ RD 30X24IN THK3 MIL C8-10GL LLDPE INFCT WASTE CAN PNCT RST (MED SURG SUPPLIES) ×2
BAG BIOHAZ RD 43X30IN THK3 MIL C20-30GL LLDPE INFCT WASTE (MED SURG SUPPLIES) ×1
BAG BIOHAZ RD 43X30IN THK3 MIL C20-30GL LLDPE INFCT WASTE CAN PNCT RST (MED SURG SUPPLIES) ×1 IMPLANT
BANDAGE ELT 5.8YDX4IN NONST SLFCLS ELAS KNIT TEAL END STCH (WOUND CARE/ENTEROSTOMAL SUPPLY) ×1
BANDAGE ELT 5.8YDX4IN NONST SLFCLS ELAS KNIT TEAL END STCH VELCRO COTTON POLY BLND STD LGTH COMPRESS (WOUND CARE SUPPLY) ×1 IMPLANT
BANDAGE ESMARK 9FTX6IN STRL SYN COMPRESS LF (WOUND CARE SUPPLY) ×1 IMPLANT
BANDAGE ESMARK 9FTX6IN STRL SY_N COMPRESS LF (WOUND CARE/ENTEROSTOMAL SUPPLY) ×1
BLADE 11 2 END CBNSTL SURG STRL DISP (CUTTING ELEMENTS) ×1
BLADE 11 2 END CBNSTL SURG STRL DISP (SURGICAL CUTTING SUPPLIES) ×1 IMPLANT
BLADE SAW 24.5X9MM SGTL SS THK.64MM XSH NRW THN STRL LF (CUTTING ELEMENTS) ×1
BLADE SAW 24.5X9MM SGTL SS THK.64MM XSH NRW THN STRL LF (SURGICAL CUTTING SUPPLIES) ×1 IMPLANT
BLADE SHAVER 13CM 4MM COOLCUT 2 CUT POWER SFT TISS RESCT STRL DISP (ENDOSCOPIC SUPPLIES) ×1 IMPLANT
BLADE SHAVER 13CM 4MM COOLCUT_2 CUT PWR SFT TISS RESCT STRL (INSTRUMENTS ENDOMECHANICAL) ×1
CLEANER INSTR PREPZYME MUL-TRD CONTAINR NARSL NEUT PH BDGR (MISCELLANEOUS PT CARE ITEMS) ×1
CONV USE ITEM 321852 - GLOVE SURG 6.5 LF  PLISPRN (GLOVES AND ACCESSORIES) ×1 IMPLANT
CONV USE ITEM 321983 - GLOVE SURG 8 LTX CHEMO PF SMOOTH BEAD CUF STRL WHT 11.6IN PLMR THK.2MM THK.21MM (GLOVES AND ACCESSORIES) ×1 IMPLANT
CONV USE ITEM 322852 - ROLL ECODRI-SAFE ABS POLY BCK ORTHO (ORTHOPEDICS (NOT IMPLANTS)) ×1 IMPLANT
CONV USE ITEM 329146 - CLEANER INSTR PREPZYME MUL-TRD CONTAINR NARSL NEUT PH BDGR 22OZ (MISCELLANEOUS PT CARE ITEMS) ×1 IMPLANT
CONV USE ITEM 338662 - PACK SURG ASCP STRL DISP ~~LOC~~ BPT MED CNTR LF (CUSTOM TRAYS & PACK) ×1 IMPLANT
CONV USE ITEM 81225 - BAG BIOHAZ RD 30X24IN THK3 MIL C8-10GL LLDPE INFCT WASTE CAN PNCT RST (MED SURG SUPPLIES) ×2 IMPLANT
COUNTER 20 CNT BLOCK ADH NEEDLE STRL LF  RD SHARP FOAM 15.75X11.5X14IN DISP (MED SURG SUPPLIES) ×1 IMPLANT
COUNTER 20 CNT BLOCK ADH NEEDLE STRL LF RD SHARP FOAM 15.75 (MED SURG SUPPLIES) ×1
COVER 53X24IN MAYOSTAND PRXM STRL DISP EQP SMS LF (DRAPE/PACKS/SHEETS/OR TOWEL) ×1 IMPLANT
COVER TBL 90X50IN STD SMS REINF FNFLD STRL LF  DISP (DRAPE/PACKS/SHEETS/OR TOWEL) ×2 IMPLANT
COVER TBL 90X50IN STD SMS REINF FNFLD STRL LF DISP (DRAPE/PACKS/SHEETS/OR TOWEL) ×2
DEVICE TIGHTROPE 10MM 4 PNTLK_ADJ LOOP DRILL H BONE TEND ×1 IMPLANT
DISC USE 162338 - NEEDLE HYPO 18GA 1.5IN TW BD_POLYPROP REG BVL LL HUB (MED SURG SUPPLIES) ×1
DISCONTINUED USE 31829 - NEEDLE HYPO 18GA 1.5IN TW BD_POLYPROP REG BVL LL HUB (MED SURG SUPPLIES) ×1 IMPLANT
DRAPE ASCP ABS REINF FENESTRATE FL CNTRL PCH 121X90IN EXTREMITY STD LF  STRL DISP SURG SMS 37X29IN (DRAPE/PACKS/SHEETS/OR TOWEL) ×1 IMPLANT
DRAPE ASCP ABS REINF FENESTRAT_E FL CNTRL PCH 121X90IN (DRAPE/PACKS/SHEETS/OR TOWEL) ×1
DRAPE MAYOSTAND CVR 53X24IN PR_XM LF STRL DISP EQP SMS (DRAPE/PACKS/SHEETS/OR TOWEL) ×1
DURAPREP 26ML 8630 CS/20 (MED SURG SUPPLIES) ×1
GLOVE SURG 6.5 LF PF SMOOTH STRL WHT PLISPRN (GLOVES AND ACCESSORIES) ×1
GLOVE SURG 8 LTX PF SMOOTH STRL CRM (GLOVES AND ACCESSORIES) ×1
GOWN SURG XL XLNG L4 REINF HKLP CLSR SET IN SLEEVE STRL LF (DRAPE/PACKS/SHEETS/OR TOWEL) ×3
GOWN SURG XL XLNG L4 REINF HKLP CLSR SET IN SLEEVE STRL LF  DISP BLU SIRUS SMS PE 56IN (DRAPE/PACKS/SHEETS/OR TOWEL) ×3
GRAFT SFT TISS 10MM FLEXIGRAFT DWL PATEL LGMNT TEND (IMPLANTS GRAFT/TISSUE) ×1
HDPE THK22 UM C40-45 GL L48 IN X W40 IN NATURAL (MISCELLANEOUS PT CARE ITEMS) ×2 IMPLANT
NEEDLE SPINAL PNK 3.5IN 18GA QUINCKE REG WL POLYPROP QUINCKE TIP STRL LF  DISP (MED SURG SUPPLIES) ×1 IMPLANT
NEEDLE SPINAL PNK 3.5IN 18GA Q_UINCKE STD LGTH BVL FIT STY (MED SURG SUPPLIES) ×1
PACK ARTHROGRAM MDLN INDUSTRIES INC. RAD GEN N/M S DISP (CUSTOM TRAYS & PACK) ×1
PACK SURG ASCP STRL DISP ~~LOC~~ BPT MED CNTR LF (CUSTOM TRAYS & PACK) ×1
PMP TUBING 13FT CONT WV III DU_ALWAVE ARTHRO STRL DISP (MED SURG SUPPLIES) ×1
PUMP TUBING 13FT CONT WV III DUALWAVE ARTHRO STRL DISP (MED SURG SUPPLIES) ×1 IMPLANT
ROLL ECODRI-SAFE ABS POLY BCK ORTHO (ORTHOPEDICS (NOT IMPLANTS)) ×1
SOL IRRG LR 3L PRSV N-PYRG FLXB CONTAINR STRL LF (MEDICATIONS/SOLUTIONS) ×4 IMPLANT
SOL SURG PREP 26ML DRPRP 74% ISPRP 0.7% IOD POVACRYLEX SLF CNTN APPL SKIN STRL PREOP (MED SURG SUPPLIES) ×1 IMPLANT
SPONGE GAUZE 4X4IN MDCHC COTTON 12 PLY TY 7 LF  STRL DISP (WOUND CARE SUPPLY) ×1 IMPLANT
SPONGE GAUZE 4X4IN MDCHC COTTO_N 12 PLY TY 7 LF STRL DISP (WOUND CARE/ENTEROSTOMAL SUPPLY) ×1
SUTURE 4-0 C-14 SURGIPRO2 18IN BLU MONOF NONAB (SUTURE/WOUND CLOSURE) ×2 IMPLANT
SYRINGE LL 10ML LF  STRL GRAD N-PYRG DEHP-FR PVC FREE MED DISP (MED SURG SUPPLIES) ×1 IMPLANT
SYRINGE LL 10ML LF STRL MED D_ISP (MED SURG SUPPLIES) ×1
TOWEL 24X16IN COTTON BLU DISP SURG STRL LF (DRAPE/PACKS/SHEETS/OR TOWEL) ×4 IMPLANT
TUBE BUBBLE CONNECTING_8888280214 1EA/BX/CS (MED SURG SUPPLIES) ×2
TUBING CONN 6MM X 3.7M (MED SURG SUPPLIES) ×2
TUBING EXT PIECE AR-6220 BX/20 (INSTRUMENTS ENDOMECHANICAL) ×1
TUBING IRRG 6FT DUALWAVE BKFL CK VALVE EXT STRL DISP (ENDOSCOPIC SUPPLIES) ×1 IMPLANT
TUBING SUCT CLR 100FT 3/16IN ARGYLE UNIV PVC NCDTV BBL NONST LF (MED SURG SUPPLIES) ×2 IMPLANT
TUBING SUCT CLR 12FT .25IN ARGYLE PVC NCDTV STR MALE FEMALE MLD CONN STRL LF (MED SURG SUPPLIES) ×1 IMPLANT
TUBING SUCT CLR 6FT .25IN ARGYLE PVC NCDTV STR MALE FEMALE (MED SURG SUPPLIES) ×1
TUBING SUCT CLR 6FT .25IN ARGYLE PVC NCDTV STR MALE FEMALE MLD CONN STRL LF (MED SURG SUPPLIES) ×1 IMPLANT

## 2021-10-09 NOTE — Anesthesia Transfer of Care (Signed)
ANESTHESIA TRANSFER OF CARE   Scott Bradshaw is a 61 y.o. ,male, Weight: 104 kg (230 lb)   had Procedure(s):  LATERAL AND MEDIAL MENISECTOMY ; DEBRIDEMENT LEFT KNEE  performed  10/09/21   Primary Service: Karsten Ro, MD    Past Medical History:   Diagnosis Date   . Anxiety    . Asthma    . Depression    . Diabetes mellitus, type 2 (CMS HCC)    . Esophageal reflux    . Hypercholesterolemia    . Hypertension    . Sarcoidosis    . Ulcerative colitis (CMS [place])       Allergy History as of 10/09/21     PENICILLINS       Noted Status Severity Type Reaction    10/09/21 0746 Duffy, Lattie Haw, CPhT 01/31/02 Active Medium  Hives/ Urticaria              I completed my transfer of care / handoff to the receiving personnel during which we discussed:  Access, Airway, All key/critical aspects of case discussed, Antibiotics, Analgesia, Expectation of post procedure, Fluids/Product, Gave opportunity for questions and acknowledgement of understanding, Labs and PMHx      Post Location: PACU                                                           Last OR Temp: Temperature: 36.2 C (97.2 F)  ABG:  POTASSIUM   Date Value Ref Range Status   10/05/2021 4.7 3.5 - 5.1 mmol/L Final     KETONES   Date Value Ref Range Status   10/05/2021 Negative Negative, Trace mg/dL Final     CALCIUM   Date Value Ref Range Status   10/05/2021 10.3 8.6 - 10.3 mg/dL Final     Calculated P Axis   Date Value Ref Range Status   10/05/2021 17 degrees Final     Calculated R Axis   Date Value Ref Range Status   10/05/2021 47 degrees Final     Calculated T Axis   Date Value Ref Range Status   10/05/2021 38 degrees Final     Airway:* No LDAs found *  Blood pressure 130/85, pulse 68, temperature 36.2 C (97.2 F), resp. rate 16, height 1.829 m (6'), weight 104 kg (230 lb), SpO2 100 %.

## 2021-10-09 NOTE — Anesthesia Procedure Notes (Signed)
Scott Bradshaw    Airway Note  General Information and Staff   Authorizing provider: Almyra Free, CRNA  Performing provider: Almyra Free, CRNA        Urgency: elective    Airway not difficult    Indications and Patient Condition  Pt location: In Or  Indications for airway management: anesthesia  Spontaneous ventilation: present  Sedation level: deep  Preoxygenated: yes  Patient position: sniffing  Mask difficulty assessment: 2 - vent by mask + OA or adjuvant +/- NMBA        Final Airway Details  Final airway type: endotracheal airway        Successful airway: ETT and cuffed     Successful intubation technique: video laryngoscopy  Facilitating devices/methods: intubating stylet  Video scope brand: GlideScope     Bullard Type: Adult     ILMA Procedure :Standard blade  Endotracheal tube insertion site: oral  Blade size: #3  Airway size (mm): 7.5  Cormack-Lehane Classification: grade I - full view of glottis  Placement verified by: chest auscultation and capnometry   Marked at 24  Measured from: lips  Secured with: Tape  Number of attempts at approach: 1Airway complications: Atraumatic

## 2021-10-09 NOTE — OR PostOp (Signed)
1525: PT. REQUESTS TO BE DISCHARGED AT THIS TIME. O2 SAT HAS REMAINED ABOVE 97% SINCE ARRIVAL TO DAY SURGERY AND PT. USES His C-PAP DAILY. TELEPHONE ORDER RECEIVED FROM DR. ONGLATCO, PT. CAN BE DISCHARGED AT THIS TIME.

## 2021-10-09 NOTE — Anesthesia Postprocedure Evaluation (Signed)
Anesthesia Post Op Evaluation    Patient: Scott Bradshaw  Procedure(s):  LATERAL AND MEDIAL MENISECTOMY ; DEBRIDEMENT LEFT KNEE    Last Vitals:Temperature: 36.2 C (97.2 F) (10/09/21 1157)  Heart Rate: 68 (10/09/21 1157)  BP (Non-Invasive): 130/85 (10/09/21 1157)  Respiratory Rate: 16 (10/09/21 1155)  SpO2: 100 % (10/09/21 1157)    No notable events documented.      Patient location during evaluation: bedside       Patient participation: complete - patient participated  Level of consciousness: awake and alert    Pain management: satisfactory to patient  Airway patency: patent    Anesthetic complications: no  Cardiovascular status: hemodynamically stable  Respiratory status: acceptable  Hydration status: acceptable  Patient post-procedure temperature: Pt Normothermic   PONV Status: Absent

## 2021-10-09 NOTE — Discharge Instructions (Addendum)
Return to office on June 20th, at 9:00 AM for your follow up appointment.    Weight bearing as tolerated. Ambulate with periods of rest.     Call the doctor with any questions or concerns that you may have.     Script:  Hydrocodone 5mg /325 mg po every 4 hours as needed for pain.

## 2021-10-09 NOTE — OR Surgeon (Signed)
[place] [facility]  Operative Note     PATIENT NAME:  Scott Bradshaw, Scott Bradshaw  MRN:  [id number]  DOB:  [birth year]    Date of Procedure:  10/09/2021  Preoperative Diagnosis: ANTERIOR CRUCIATE LIGAMENT AND MEDIAL MENISCUS TEARS  LEFT KNEE   Postoperative Diagnoses:  MEDIAL AND LATERAL MENISCAL TEARS LEFT KNEE   Procedure Performed: Left knee partial medial and lateral menisectomy, debridement of joint.  Implants:* No implants in log *   Surgeon: Alexia Freestone, MD   Anesthesia: CRNA: Levi Aland, CRNA   OR Staff: Circulator: Octavio Graves, RN  PERIOPERATIVE CARE ASSISTANT: Avel Peace, PCA  Relief Scrub: Raymond, Olegario Messier, CST  Scrub Person: Dola Argyle, ST  Scrub First Assist: Almyra Free, ST  Anesthesia Tech: Hoy Finlay, AA   Anticoagulation: NA    Antibiotics:    Estimated Blood Loss: * No blood loss amount entered *   Specimens: * No specimens in log *   Complications: None immediate  Findings:  Medial meniscus:  Complex tear posterior 2/3   Inner 1/3 resected  Lateral meniscus: Radial tear junction ant 1/3, edges removed.    Articular Cartilage:    Site Outerbridge Grade Size and Notes   Patella: 3 Central and lateral   Trochlea:   1 central   Medial femur 2-3  Posterolateral 1/3   Medial Tibia 2 Mid 1/3 below mm tear   Lateral femur 0    Lateral tibia 0      Grading Notes:  Only abnormal recorded.   0: normal (no entry)   1: Softening;    2: Fissures or minor fragmentation;   3: partial thickness loss, "crab meat" appearance;   4:  Cartilage destruction with exposed subchondral bone    Ligaments:    MCL:    Nl    LCL       Nl    ACL      NL    PCL      NL  Synovial:  Effusion: Small  Loose bodies: NA  Patellar Tracking: Lateral riding.    Indications For Procedure:  Scott Bradshaw  is a very pleasant  61 y.o. male  presenting  for ANTERIOR CRUCIATE LIGAMENT AND MEDIAL MENISCUS TEARS  LEFT KNEE Risks, benefits, indications, and complications of procedure were discussed, and  informed signed consent obtained.  Description of procedure:  Side and site verified.  Time out completed with entire operating room staff.  Satisfactory anesthesia induced.  Patient carefully positioned with operative leg in leg holder and opposite leg in well leg support.  All bone prominences padded and protected.  Sterile preparation and draping completed.  Diagnostic arthroscopy completed with above findings.  ACL was normal to inspection, MCL stable on EUA.  Limited debridement / synovectomy:  Limited debridement / synovectomy: Working through medial and lateral parapatellar portals, all hypertrophic synovium and cartilage debris removed using a shaver and hand instruments.   PF and medial compartments debrided.    Medial meniscus excision:  Medial meniscus excision: Working through medial and lateral parapatellar portals, all unstable meniscus tissue was excised using basket forceps and shaver.  The remnant was palpated with a probe to insure resection to a stable border.    Lateral meniscus excision:  Lateral meniscus excision: Working through medial and lateral parapatellar portals, radial tear was excised using basket forceps and shaver.  The remnant was palpated with a probe to insure resection to a stable border.  At completion of the procedure,  irrigation was drained from the knee.  Ropivicaine was injected.  Stab incisions were closed with interrupted sutures and dressings applied.  The patient was taken to recovery under the supervision of anesthesia.

## 2021-10-09 NOTE — Anesthesia Preprocedure Evaluation (Addendum)
ANESTHESIA PRE-OP EVALUATION  Planned Procedure: ARTHROSCOPIC ANTERIOR CRUCIATE LIGAMENT  AND MENISCAL TENDON REPAIR ; DEBRIDEMENT LEFT KNEE (Left: Knee)  Review of Systems     anesthesia history negative     patient summary reviewed  nursing notes reviewed        Pulmonary   asthma, sleep apnea, CPAP and home oxygen (at night),   Cardiovascular    Hypertension, well controlled and ECG reviewed ,No peripheral edema,  Exercise Tolerance: > or = 4 METS        GI/Hepatic/Renal    GERD and well controlled        Endo/Other    obesity,   type 2 diabetes/ stable/ controlled with insulin    Neuro/Psych/MS    anxiety, depression     Cancer    negative hematology/oncology ROS,                 Physical Assessment      Airway       Mallampati: III    TM distance: <3 FB    Neck ROM: full  Mouth Opening: good.  Facial hair  Beard        Dental           (+) caps           Pulmonary    Breath sounds clear to auscultation  (-) no rhonchi, no decreased breath sounds, no wheezes, no rales and no stridor     Cardiovascular    Rhythm: regular  Rate: Normal  (-) no friction rub, carotid bruit is not present, no peripheral edema and no murmur     Other findings  Caps upper right and left central incisors. Multiple caps -molars          Plan  ASA 3     Planned anesthesia type: general     general anesthesia with endotracheal tube intubation    plan to administer opioids postoperatively    SLEEP APNEA  Patient is at risk of obstructive sleep apnea and Education provided regarding risk of obstructive sleep apnea    Additional Plans: RSI    PONV/POV Plan:  I plan to administer pharmcologic prophalaxis antiemetics  Intravenous induction     Anesthesia issues/risks discussed are: Dental Injuries, Stroke, Nerve Injuries, Intraoperative Awareness/ Recall, Eye /Visual Loss, Aspiration, Blood Loss, PONV, Difficult Airway, Sore Throat and Cardiac Events/MI.  Anesthetic plan and risks discussed with patient  Signed consent obtained                        Plan discussed with CRNA.    (Use glidescope. Patient had snuff at 0630 today.)

## 2021-10-20 ENCOUNTER — Encounter (INDEPENDENT_AMBULATORY_CARE_PROVIDER_SITE_OTHER): Payer: Self-pay | Admitting: NURSE PRACTITIONER

## 2021-10-30 ENCOUNTER — Other Ambulatory Visit (INDEPENDENT_AMBULATORY_CARE_PROVIDER_SITE_OTHER): Payer: Self-pay | Admitting: NURSE PRACTITIONER

## 2021-10-30 ENCOUNTER — Other Ambulatory Visit: Payer: Self-pay

## 2021-10-30 ENCOUNTER — Encounter (INDEPENDENT_AMBULATORY_CARE_PROVIDER_SITE_OTHER): Payer: Self-pay | Admitting: NURSE PRACTITIONER

## 2021-10-30 ENCOUNTER — Ambulatory Visit (INDEPENDENT_AMBULATORY_CARE_PROVIDER_SITE_OTHER): Payer: 59 | Admitting: NURSE PRACTITIONER

## 2021-10-30 VITALS — BP 139/85 | HR 95 | Ht 72.0 in | Wt 238.0 lb

## 2021-10-30 DIAGNOSIS — E119 Type 2 diabetes mellitus without complications: Secondary | ICD-10-CM

## 2021-10-30 DIAGNOSIS — R0609 Other forms of dyspnea: Secondary | ICD-10-CM

## 2021-10-30 DIAGNOSIS — E782 Mixed hyperlipidemia: Secondary | ICD-10-CM

## 2021-10-30 DIAGNOSIS — I739 Peripheral vascular disease, unspecified: Secondary | ICD-10-CM

## 2021-10-30 DIAGNOSIS — R9431 Abnormal electrocardiogram [ECG] [EKG]: Secondary | ICD-10-CM

## 2021-10-30 DIAGNOSIS — I10 Essential (primary) hypertension: Secondary | ICD-10-CM

## 2021-10-30 NOTE — Progress Notes (Signed)
Cardiology [facility]    Name: Scott Bradshaw  Age: 61 y.o.  Date of Service: 10/30/2021    Primary Care Provider: Saralyn Pilar, FNP  Chief Complaint:   Chief Complaint   Patient presents with   . Hyperlipidemia   . Hypertension   . Follow Up       Subjective:   Scott Bradshaw is a very pleasant 61 y.o. 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 Patient has no known history of coronary artery disease. 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 He had Covid infection in January status post hospitalization and after being discharged, although he feels much better but the still is not that he is baseline breathing wise. He uses home O2 as needed, he has episodes of wheezing and dyspnea which can be on exertion or at rest, he also post Covid has episodes of palpitation and when he checks his heart rate it goes to 110s to 120s. He was seen by his PMD couple of weeks ago was started him on atenolol for blood pressure and palpitation which improved his symptoms but not completely resolved, he also has been started on statin(cholesterol 278, triglyceride 391, LDL 159, HDL 45). He denies any real chest pain. Before Covid he has been active but had to be just careful for predisposing factors of his asthma exacerbations including hot/cold weather. He used to see a pulmonologist in [place], but now has an appointment to be seen by Dr. Dub Mikes in [place].    10/31/2020. Patient presented for follow-up visit accompanied by his wife. He feels good and denies any chest pain, he is still have some dyspnea on exertion but he feels that as the time passes since his COVID his breathing is improving. His Holter monitor was unremarkable, echo showed EF of 55 to 60% and nuclear stress test was low risk and normal. Patient's palpitation has dramatically  decreased.     10/30/2021: The patient presents for follow up visit.  He denies any chest pain he does have some shortness a breath but reports that has significantly improved with his weight loss.  He is on monitor neuro and has lost approximately 50 lb.  A1c has decreased to 6.1.  Blood pressure is mildly elevated 139/85 today.  He is tolerating medications.  He does complain of bilateral lower extremity pain painful walking and reports coldness to his feet that is very painful.    Past Medical History:  Past Medical History:   Diagnosis Date   . Anxiety    . Asthma    . Cardiac arrhythmia    . Depression    . Diabetes mellitus, type 2 (CMS HCC)    . DOE (dyspnea on exertion)    . Esophageal reflux    . Hypercholesterolemia    . Hypertension    . Palpitation    . Sarcoidosis    . Ulcerative colitis (CMS HCC)          Social History:  Social History     Tobacco Use   Smoking Status Never   Smokeless Tobacco Current      Social History     Substance and Sexual Activity   Alcohol Use Yes   . Alcohol/week: 10.0 standard drinks   . Types: 10 Cans of beer per week    Comment: socially      Social History  Substance and Sexual Activity   Drug Use Never      Current Medications:  Current Outpatient Medications   Medication Sig   . albuterol sulfate (PROVENTIL OR VENTOLIN OR PROAIR) 90 mcg/actuation Inhalation oral inhaler Take 2 Puffs by inhalation Every 4 hours as needed   . aspirin (ECOTRIN) 81 mg Oral Tablet, Delayed Release (E.C.) Take 1 Tablet (81 mg total) by mouth Once a day   . atenoloL (TENORMIN) 25 mg Oral Tablet Take 1 Tablet (25 mg total) by mouth Once a day   . clonazePAM (KLONOPIN) 0.5 mg Oral Tablet Take 1 Tablet (0.5 mg total) by mouth Once per day as needed   . ergocalciferol, vitamin D2, (DRISDOL) 1,250 mcg (50,000 unit) Oral Capsule Take 1 Capsule (50,000 Units total) by mouth Every 7 days   . esomeprazole magnesium (NEXIUM) 40 mg Oral Capsule, Delayed Release(E.C.) Take 1 Capsule (40 mg total) by  mouth Every morning before breakfast   . fluticasone-umeclidin-vilanter 200-62.5-25 mcg Inhalation Disk with Device Take 1 INHALATION  by inhalation Once a day   . furosemide (LASIX) 40 mg Oral Tablet Take 1 Tablet (40 mg total) by mouth Once a day   . HYDROcodone-acetaminophen (NORCO) 5-325 mg Oral Tablet Take 1 Tablet by mouth Every 4 hours as needed for Pain (Patient not taking: Reported on 10/30/2021)   . hydrOXYzine pamoate (VISTARIL) 25 mg Oral Capsule Take 1 Capsule (25 mg total) by mouth Three times a day as needed   . Ibuprofen (MOTRIN) 800 mg Oral Tablet Take 1 Tablet (800 mg total) by mouth Three times a day as needed for Pain   . insulin degludec 100 unit/mL (3 mL) Subcutaneous Insulin Pen Inject 20 Units under the skin Every evening   . ipratropium-albuterol 0.5 mg-3 mg(2.5 mg base)/3 mL Solution for Nebulization Take 3 mL by nebulization Four times a day as needed   . loratadine (CLARITIN) 10 mg Oral Tablet Take 1 Tablet (10 mg total) by mouth Once a day   . LORazepam (ATIVAN) 0.5 mg Oral Tablet Take 1 Tablet (0.5 mg total) by mouth Once per day as needed for Anxiety   . montelukast (SINGULAIR) 10 mg Oral Tablet Take 1 Tablet (10 mg total) by mouth Once a day   . oxygen (O2) Inhalation gas 2 L/min Every evening   . potassium chloride (K-DUR) 20 mEq Oral Tab Sust.Rel. Particle/Crystal Take 1 Tablet (20 mEq total) by mouth Once a day   . testosterone 1.62 % (20.25 mg/1.25 gram) Transdermal Gel in Packet Place 2 Sprays on the skin Once a day   . tirzepatide (MOUNJARO) 5 mg/0.5 mL Subcutaneous Pen Injector Inject 0.5 mL (5 mg total) under the skin Every 7 days   . vilazodone (VIIBRYD) 40 mg Oral Tablet Take 1 Tablet (40 mg total) by mouth Once a day     Allergies:  Allergies   Allergen Reactions   . Penicillins Hives/ Urticaria      Review of Systems:  Complete ROS was performed and otherwise negative unless noted in HPI.      Vital Signs:  Vitals:    10/30/21 1320   BP: 139/85   Pulse: 95   SpO2: 95%    Weight: 108 kg (238 lb)   Height: 1.829 m (6')   BMI: 32.35      Physical Exam:  General: Pt resting comfortably in no acute distress and appears stated age.    Neck: No JVD, no carotid bruit. Neck supple, symmetrical, trachea midline.  Lungs:  Normal respiratory effort, lungs clear to auscultation bilaterally.    Cardiovascular: Regular rate and rhythm without murmurs rubs or gallops and vascular pulses are 2+ throughout.  Abdomen: Soft, non-tender and bowel sounds normal.    Extremities: Extremities normal, atraumatic, no cyanosis or edema.    Neurologic: Alert and oriented x3.       Assessment: Hypertension, unspecified type    DM (diabetes mellitus) (CMS HCC)    Mixed hyperlipidemia    DOE (dyspnea on exertion)    Intermittent claudication (CMS HCC)     Plan:  Patient is doing well with weight loss and currently the diabetes is well controlled.  Advised to continue current medications.  Suggest ABI ultrasound of the bilateral lower extremities to evaluate or PID.  We will follow up in 1 year ultrasound is normal.      Orders placed this visit:  Orders Placed This Encounter   . EKG (In-Clinic Today)   . 14431 - LIMITED BILATERAL NONINVASIVE PHYSIOLOGIC STUDIES OF UPPER/LOWER EXTREMITY ARTERIE (AMB ONLY)       Scott Bradshaw is to return to clinic for follow up with the understanding that should symptoms change or worsen he is to call the office or go to the closest emergency department for evaluation.    Peggi Yono, FNP-C    A portion of this documentation may have been generated using MMODAL voice recognition software and may contain syntax/voice recognition errors.

## 2021-10-31 ENCOUNTER — Telehealth (INDEPENDENT_AMBULATORY_CARE_PROVIDER_SITE_OTHER): Payer: Self-pay | Admitting: INTERVENTIONAL CARDIOLOGY

## 2021-12-05 ENCOUNTER — Ambulatory Visit (HOSPITAL_COMMUNITY): Payer: Self-pay

## 2021-12-07 ENCOUNTER — Telehealth (INDEPENDENT_AMBULATORY_CARE_PROVIDER_SITE_OTHER): Payer: Self-pay | Admitting: INTERVENTIONAL CARDIOLOGY

## 2021-12-07 NOTE — Telephone Encounter (Signed)
Pt called and left message that his ABI is scheduled at [facility] on 01/04/2022 at 1200pm. I left office phone number for pt to call if any questions.

## 2022-01-04 ENCOUNTER — Inpatient Hospital Stay
Admission: RE | Admit: 2022-01-04 | Discharge: 2022-01-04 | Disposition: A | Discharge status: Home / Self Care | Payer: 59 | Source: Ambulatory Visit | Attending: NURSE PRACTITIONER | Admitting: NURSE PRACTITIONER

## 2022-01-04 ENCOUNTER — Other Ambulatory Visit: Payer: Self-pay

## 2022-01-04 DIAGNOSIS — I739 Peripheral vascular disease, unspecified: Secondary | ICD-10-CM | POA: Insufficient documentation

## 2022-01-04 DIAGNOSIS — R238 Other skin changes: Secondary | ICD-10-CM

## 2022-02-04 ENCOUNTER — Other Ambulatory Visit: Payer: Self-pay

## 2022-03-17 LAB — ECG W INTERP (AMB USE ONLY)(MUSE,IN CLINIC)
Atrial Rate: 87 {beats}/min
Calculated P Axis: 45 degrees
Calculated R Axis: -35 degrees
Calculated T Axis: 81 degrees
PR Interval: 132 ms
QRS Duration: 88 ms
QT Interval: 360 ms
QTC Calculation: 433 ms
Ventricular rate: 87 {beats}/min

## 2022-04-11 ENCOUNTER — Other Ambulatory Visit (HOSPITAL_COMMUNITY): Payer: Self-pay

## 2022-04-11 DIAGNOSIS — R0602 Shortness of breath: Secondary | ICD-10-CM

## 2022-04-19 ENCOUNTER — Emergency Department (HOSPITAL_BASED_OUTPATIENT_CLINIC_OR_DEPARTMENT_OTHER): Payer: 59

## 2022-04-19 ENCOUNTER — Other Ambulatory Visit: Payer: Self-pay

## 2022-04-19 ENCOUNTER — Emergency Department
Admission: EM | Admit: 2022-04-19 | Discharge: 2022-04-20 | Disposition: A | Discharge status: Home / Self Care | Payer: 59 | Attending: Family | Admitting: Family

## 2022-04-19 DIAGNOSIS — M549 Dorsalgia, unspecified: Secondary | ICD-10-CM

## 2022-04-19 DIAGNOSIS — D869 Sarcoidosis, unspecified: Secondary | ICD-10-CM

## 2022-04-19 DIAGNOSIS — R071 Chest pain on breathing: Secondary | ICD-10-CM

## 2022-04-19 DIAGNOSIS — Z1152 Encounter for screening for COVID-19: Secondary | ICD-10-CM | POA: Insufficient documentation

## 2022-04-19 DIAGNOSIS — R1310 Dysphagia, unspecified: Secondary | ICD-10-CM

## 2022-04-19 DIAGNOSIS — R0609 Other forms of dyspnea: Secondary | ICD-10-CM

## 2022-04-19 DIAGNOSIS — I10 Essential (primary) hypertension: Secondary | ICD-10-CM | POA: Insufficient documentation

## 2022-04-19 DIAGNOSIS — Z8249 Family history of ischemic heart disease and other diseases of the circulatory system: Secondary | ICD-10-CM | POA: Insufficient documentation

## 2022-04-19 DIAGNOSIS — R0789 Other chest pain: Secondary | ICD-10-CM

## 2022-04-19 LAB — CBC WITH DIFF
BASOPHIL #: 0.02 10*3/uL (ref 0.00–0.30)
BASOPHIL %: 0 % (ref 0–3)
EOSINOPHIL #: 0.24 10*3/uL (ref 0.00–0.80)
EOSINOPHIL %: 3 % (ref 0–7)
HCT: 47.2 % (ref 42.0–51.0)
HGB: 15.6 g/dL (ref 13.5–18.0)
LYMPHOCYTE #: 2.41 10*3/uL (ref 1.10–5.00)
LYMPHOCYTE %: 34 % (ref 25–45)
MCH: 31.1 pg (ref 27.0–32.0)
MCHC: 33 g/dL (ref 32.0–36.0)
MCV: 94.3 fL (ref 78.0–99.0)
MONOCYTE #: 0.61 10*3/uL (ref 0.00–1.30)
MONOCYTE %: 9 % (ref 0–12)
MPV: 7.5 fL (ref 7.4–10.4)
NEUTROPHIL #: 3.88 10*3/uL (ref 1.80–8.40)
NEUTROPHIL %: 54 % (ref 40–76)
PLATELETS: 209 10*3/uL (ref 140–440)
RBC: 5.01 10*6/uL (ref 4.20–6.00)
RDW: 15.6 % — ABNORMAL HIGH (ref 11.6–14.8)
WBC: 7.2 10*3/uL (ref 4.0–10.5)

## 2022-04-19 LAB — D-DIMER: D-DIMER: 239 ng/mL DDU — ABNORMAL HIGH (ref <=232)

## 2022-04-19 LAB — COMPREHENSIVE METABOLIC PANEL, NON-FASTING
ALBUMIN/GLOBULIN RATIO: 0.9 (ref 0.8–1.4)
ALBUMIN: 3.7 g/dL (ref 3.4–5.0)
ALKALINE PHOSPHATASE: 98 U/L (ref 46–116)
ALT (SGPT): 32 U/L (ref <=78)
ANION GAP: 10 mmol/L (ref 4–13)
AST (SGOT): 24 U/L (ref 15–37)
BILIRUBIN TOTAL: 0.3 mg/dL (ref 0.2–1.0)
BUN/CREA RATIO: 10
BUN: 13 mg/dL (ref 7–18)
CALCIUM, CORRECTED: 10.1 mg/dL
CALCIUM: 9.9 mg/dL (ref 8.5–10.1)
CHLORIDE: 102 mmol/L (ref 98–107)
CO2 TOTAL: 26 mmol/L (ref 21–32)
CREATININE: 1.33 mg/dL — ABNORMAL HIGH (ref 0.70–1.30)
ESTIMATED GFR: 61 mL/min/{1.73_m2} (ref >59)
GLOBULIN: 4.1
GLUCOSE: 94 mg/dL (ref 74–106)
OSMOLALITY, CALCULATED: 276 mOsm/kg (ref 270–290)
POTASSIUM: 4 mmol/L (ref 3.5–5.1)
PROTEIN TOTAL: 7.8 g/dL (ref 6.4–8.2)
SODIUM: 138 mmol/L (ref 136–145)

## 2022-04-19 LAB — COVID-19, FLU A/B, RSV RAPID BY PCR
INFLUENZA VIRUS TYPE A: NOT DETECTED
INFLUENZA VIRUS TYPE B: NOT DETECTED
RESPIRATORY SYNCTIAL VIRUS (RSV): NOT DETECTED
SARS-CoV-2: NOT DETECTED

## 2022-04-19 LAB — MAGNESIUM: MAGNESIUM: 2 mg/dL (ref 1.8–2.4)

## 2022-04-19 LAB — PTT (PARTIAL THROMBOPLASTIN TIME): APTT: 36 seconds — ABNORMAL HIGH (ref 22.4–31.7)

## 2022-04-19 LAB — TROPONIN-I
TROPONIN I: <2 ng/L (ref <20)
TROPONIN I: <2 ng/L (ref <20)
TROPONIN I: <2 ng/L (ref <20)

## 2022-04-19 MED ORDER — PROCHLORPERAZINE EDISYLATE 10 MG/2 ML (5 MG/ML) INJECTION SOLUTION
5.0000 mg | INTRAMUSCULAR | Status: AC
Start: 2022-04-20 — End: 2022-04-19
  Administered 2022-04-19: 5 mg via INTRAVENOUS

## 2022-04-19 MED ORDER — NITROGLYCERIN 0.4 MG SUBLINGUAL TABLET
0.4000 mg | SUBLINGUAL_TABLET | SUBLINGUAL | Status: DC
Start: 2022-04-19 — End: 2022-04-20
  Administered 2022-04-19: 0 mg via SUBLINGUAL

## 2022-04-19 MED ORDER — ASPIRIN 81 MG CHEWABLE TABLET
CHEWABLE_TABLET | ORAL | Status: AC
Start: 2022-04-19 — End: 2022-04-19
  Filled 2022-04-19: qty 4

## 2022-04-19 MED ORDER — ASPIRIN 81 MG CHEWABLE TABLET
324.0000 mg | CHEWABLE_TABLET | ORAL | Status: AC
Start: 2022-04-19 — End: 2022-04-19
  Administered 2022-04-19: 324 mg via ORAL

## 2022-04-19 MED ORDER — NITROGLYCERIN 0.4 MG SUBLINGUAL TABLET
SUBLINGUAL_TABLET | SUBLINGUAL | Status: AC
Start: 2022-04-19 — End: 2022-04-19
  Filled 2022-04-19: qty 1

## 2022-04-19 MED ORDER — MORPHINE 4 MG/ML INJECTION WRAPPER
INJECTION | INTRAMUSCULAR | Status: AC
Start: 2022-04-19 — End: 2022-04-19
  Filled 2022-04-19: qty 1

## 2022-04-19 MED ORDER — MORPHINE 4 MG/ML INJECTION WRAPPER
4.0000 mg | INJECTION | INTRAMUSCULAR | Status: AC
Start: 2022-04-20 — End: 2022-04-19
  Administered 2022-04-19: 4 mg via INTRAVENOUS

## 2022-04-19 MED ORDER — PROCHLORPERAZINE EDISYLATE 10 MG/2 ML (5 MG/ML) INJECTION SOLUTION
INTRAMUSCULAR | Status: AC
Start: 2022-04-19 — End: 2022-04-19
  Filled 2022-04-19: qty 2

## 2022-04-19 MED ORDER — TIZANIDINE 4 MG TABLET
4.0000 mg | ORAL_TABLET | Freq: Three times a day (TID) | ORAL | 0 refills | Status: AC
Start: 2022-04-19 — End: ?

## 2022-04-19 MED ORDER — NITROGLYCERIN 0.4 MG SUBLINGUAL TABLET
0.4000 mg | SUBLINGUAL_TABLET | SUBLINGUAL | Status: AC
Start: 2022-04-19 — End: 2022-04-19
  Administered 2022-04-19: 0.4 mg via SUBLINGUAL

## 2022-04-19 MED ORDER — IOHEXOL 350 MG IODINE/ML INTRAVENOUS SOLUTION
100.0000 mL | INTRAVENOUS | Status: AC
Start: 2022-04-19 — End: 2022-04-19
  Administered 2022-04-19: 100 mL via INTRAVENOUS

## 2022-04-19 MED ORDER — MELOXICAM 7.5 MG TABLET
7.5000 mg | ORAL_TABLET | Freq: Every day | ORAL | 0 refills | Status: AC
Start: 2022-04-19 — End: 2022-05-03

## 2022-04-19 MED ORDER — ACETAMINOPHEN 300 MG-CODEINE 30 MG TABLET
1.00 | ORAL_TABLET | Freq: Four times a day (QID) | ORAL | 0 refills | Status: AC | PRN
Start: 2022-04-19 — End: ?

## 2022-04-19 NOTE — ED Provider Notes (Signed)
[facility] Emergency Department  ED Primary Provider Note  History of Present Illness   Chief Complaint   Patient presents with    Chest Pain      Arrival: The patient arrived by Car  Scott Bradshaw is a 61 y.o. male who had concerns including Chest Pain . Pt states interm cp into back interm painful breathing. States had a stress test this year that was ok. Hx of HTN, dm. Fam hx of cad. Also has hx of sarcoidosis.   Sx better with rest, worse with exertion, fed barn animals today and had pain.   Review of Systems   Constitutional: No fever, chills or weakness   Skin: No rash or diaphoresis  HENT: No headaches, or congestion + dysphagia.   Eyes: No vision changes or photophobia   Cardio: +chest pain, no palpitations or leg swelling   Respiratory: No cough, wheezing+ interm  SOB  GI:  No nausea, vomiting or stool changes  GU:  No dysuria, hematuria, or increased frequency  MSK: No muscle aches, joint + neck back pain  Neuro: No seizures, LOC, numbness, tingling, or focal weakness  Psychiatric: No depression, SI or substance abuse  All other systems reviewed and are negative.    Historical Data   History Reviewed This Encounter:all noted and reviewed      Physical Exam   ED Triage Vitals [04/19/22 1933]   BP (Non-Invasive) (!) 142/95   Heart Rate 97   Respiratory Rate 18   Temperature 36.2 C (97.1 F)   SpO2 100 %   Weight 102 kg (224 lb)   Height 1.829 m (6')       Constitutional:  61 y.o. male who appears in no distress. Normal color, no cyanosis.   HENT:   Head: Normocephalic and atraumatic.   Mouth/Throat: Oropharynx is clear and moist.   Eyes: EOMI, PERRL   Neck: Trachea midline. Neck supple.  Cardiovascular: RRR, No murmurs, rubs or gallops. Intact distal pulses.  Pulmonary/Chest: BS equal bilaterally. No respiratory distress. No wheezes, rales or chest tenderness.   Abdominal: Bowel sounds present and normal. Abdomen soft, no tenderness, no rebound and no  guarding.  Back: No midline spinal tenderness, no paraspinal tenderness, no CVA tenderness.           Musculoskeletal: No edema, tenderness or deformity.  Skin: warm and dry. No rash, erythema, pallor or cyanosis  Psychiatric: normal mood and affect. Behavior is normal.   Neurological: Patient keenly alert and responsive, easily able to raise eyebrows, facial muscles/expressions symmetric, speaking in fluent sentences, moving all extremities equally and fully, normal gait  Patient Data     Labs Ordered/Reviewed   COMPREHENSIVE METABOLIC PANEL, NON-FASTING - Abnormal; Notable for the following components:       Result Value    CREATININE 1.33 (*)     All other components within normal limits    Narrative:     Estimated Glomerular Filtration Rate (eGFR) is calculated using the CKD-EPI (2021) equation, intended for patients 35 years of age and older. If gender is not documented or "unknown", there will be no eGFR calculation.   PTT (PARTIAL THROMBOPLASTIN TIME) - Abnormal; Notable for the following components:    APTT 36.0 (*)     All other components within normal limits   D-DIMER - Abnormal; Notable for the following components:    D-DIMER 239 (*)     All other components within normal limits   CBC WITH  DIFF - Abnormal; Notable for the following components:    RDW 15.6 (*)     All other components within normal limits   TROPONIN-I - Normal    Narrative:     Values received on females ranging between 12-15 ng/L MUST include the next serial troponin to review changes in the delta differences as the reference range for the Access II chemistry analyzer is lower than the established reference range.     TROPONIN-I - Normal    Narrative:     Values received on females ranging between 12-15 ng/L MUST include the next serial troponin to review changes in the delta differences as the reference range for the Access II chemistry analyzer is lower than the established reference range.     TROPONIN-I - Normal    Narrative:      Values received on females ranging between 12-15 ng/L MUST include the next serial troponin to review changes in the delta differences as the reference range for the Access II chemistry analyzer is lower than the established reference range.     COVID-19, FLU A/B, RSV RAPID BY PCR - Normal    Narrative:     Results are for the simultaneous qualitative identification of SARS-CoV-2 (formerly 2019-nCoV), Influenza A, Influenza B, and RSV RNA. These etiologic agents are generally detectable in nasopharyngeal and nasal swabs during the ACUTE PHASE of infection. Hence, this test is intended to be performed on respiratory specimens collected from individuals with signs and symptoms of upper respiratory tract infection who meet Centers for Disease Control and Prevention (CDC) clinical and/or epidemiological criteria for Coronavirus Disease 2019 (COVID-19) testing. CDC COVID-19 criteria for testing on human specimens is available at [facility] webpage information for Healthcare Professionals: Coronavirus Disease 2019 (COVID-19) ([email or web address]).     False-negative results may occur if the virus has genomic mutations, insertions, deletions, or rearrangements or if performed very early in the course of illness. Otherwise, negative results indicate virus specific RNA targets are not detected, however negative results do not preclude SARS-CoV-2 infection/COVID-19, Influenza, or Respiratory syncytial virus infection. Results should not be used as the sole basis for patient management decisions. Negative results must be combined with clinical observations, patient history, and epidemiological information. If upper respiratory tract infection is still suspected based on exposure history together with other clinical findings, re-testing should be considered.    Disclaimer:   This assay has been authorized by FDA under an Emergency Use Authorization for use in laboratories certified under the  Clinical Laboratory Improvement Amendments of 1988 (CLIA), 42 U.S.C. [phone], to perform high complexity tests. The impacts of vaccines, antiviral therapeutics, antibiotics, chemotherapeutic or immunosuppressant drugs have not been evaluated.     Test methodology:   Cepheid Xpert Xpress SARS-CoV-2/Flu/RSV Assay real-time polymerase chain reaction (RT-PCR) test on the GeneXpert Dx and Xpert Xpress systems.   MAGNESIUM - Normal   CBC/DIFF    Narrative:     The following orders were created for panel order CBC/DIFF.  Procedure                               Abnormality         Status                     ---------                               -----------         ------  CBC WITH HRCB[[id number]]                Abnormal            Final result                 Please view results for these tests on the individual orders.     CT ANGIO CHEST W IV CONTRAST   Final Result by Edi, Radresults In (12/14 2241)   NO ACUTE FINDINGS         One or more dose reduction techniques were used (e.g., Automated exposure control, adjustment of the mA and/or kV according to patient size, use of iterative reconstruction technique).         Radiologist location ID: [id number]         CT SOFT TISSUE NECK W IV CONTRAST   Final Result by Edi, Radresults In (12/14 2238)   NO ACUTE FINDINGS         One or more dose reduction techniques were used (e.g., Automated exposure control, adjustment of the mA and/or kV according to patient size, use of iterative reconstruction technique).         Radiologist location ID: [id number]         XR CHEST AP   Final Result by Edi, Radresults In (12/14 1951)   NO ACUTE FINDINGS.         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   ED Course as of 04/19/22 2311   Thu Apr 19, 2022   2250 XR CHEST AP         Medications Administered in the ED   nitroGLYCERIN (NITROSTAT) sublingual tablet (0 mg Sublingual Not  Given 04/19/22 2000)   morphine 4 mg/mL injection (has no administration in time range)   prochlorperazine (COMPAZINE) 5 mg/mL injection (has no administration in time range)   aspirin chewable tablet 324 mg (324 mg Oral Given 04/19/22 1954)   nitroGLYCERIN (NITROSTAT) sublingual tablet (0.4 mg Sublingual Given 04/19/22 1954)   iohexol (OMNIPAQUE 350) infusion (100 mL Intravenous Given 04/19/22 2212)     Clinical Impression   Atypical chest pain (Primary)   Dysphagia, unspecified type   Dyspnea on exertion   Back pain, unspecified back location, unspecified back pain laterality, unspecified chronicity   Sarcoidosis       Disposition: Discharged

## 2022-04-19 NOTE — ED Triage Notes (Signed)
Pt reports medsternal to left sided CP starting on Tuesday. Now c/o left sided CP , SOB . 3/10 . Reports pain is improved with 800mg  motrin and "muscle relaxers." Denies cardiac hx

## 2022-04-19 NOTE — Discharge Instructions (Addendum)
Please follow up for more testing.

## 2022-04-20 NOTE — ED Nurses Note (Signed)
IV DC. Cath intact.  No redness or edema at site.  Verbalized understanding of discharge instructions, rx education, and follow up information. AVS given to patient. VSS.  Left ER with no further complaints.

## 2022-04-21 DIAGNOSIS — R079 Chest pain, unspecified: Secondary | ICD-10-CM

## 2022-04-21 LAB — ECG 12 LEAD
Atrial Rate: 92 {beats}/min
Calculated P Axis: 59 degrees
Calculated R Axis: 60 degrees
Calculated T Axis: 67 degrees
PR Interval: 130 ms
QRS Duration: 84 ms
QT Interval: 342 ms
QTC Calculation: 422 ms
Ventricular rate: 92 {beats}/min

## 2022-04-23 ENCOUNTER — Inpatient Hospital Stay
Admission: RE | Admit: 2022-04-23 | Discharge: 2022-04-23 | Disposition: A | Discharge status: Home / Self Care | Payer: 59 | Source: Ambulatory Visit | Attending: PULMONARY DISEASE | Admitting: PULMONARY DISEASE

## 2022-04-23 ENCOUNTER — Other Ambulatory Visit: Payer: Self-pay

## 2022-04-23 DIAGNOSIS — R0602 Shortness of breath: Secondary | ICD-10-CM | POA: Insufficient documentation

## 2022-05-02 NOTE — Procedures (Signed)
NAME:  DELMORE, SEAR  HOSPITAL NUMBER:  [id number]  DATE OF SERVICE:  04/23/2022  DOB:  1961/04/01  SEX:  M      NAME OF STUDY:  Pulmonary function test.    SPIROMETRY:  The patient's forced vital capacity is 85% of predicted, FEV1 is 107% of predicted.  The FEV1/FVC ratio is 88%.    LUNG VOLUMES:  The patient's  vital capacity is 101% of predicted.  Residual volume is 80% of predicted.  Total lung capacity is 89% of predicted.    LUNG DIFFUSION CAPACITY:  Diffusion capacity for carbon monoxide is 113% of predicted.    IMPRESSION:  1. Normal spirometry.  2. Normal vital capacity.  3. Normal residual volume.  4. Normal total lung capacity.  5. Normal diffusion capacity.        Renee Rival, MD [facility]  Pulmonary Critical  Sleep Medicine Consultant      DD:  05/01/2022 22:53:00  DT:  05/02/2022 08:37:02 RO  D#:  [id number]

## 2022-10-27 ENCOUNTER — Other Ambulatory Visit: Payer: Self-pay

## 2022-10-27 ENCOUNTER — Emergency Department
Admission: EM | Admit: 2022-10-27 | Discharge: 2022-10-28 | Disposition: A | Discharge status: Home / Self Care | Payer: 59 | Attending: Emergency Medicine | Admitting: Emergency Medicine

## 2022-10-27 ENCOUNTER — Emergency Department (HOSPITAL_BASED_OUTPATIENT_CLINIC_OR_DEPARTMENT_OTHER): Payer: 59

## 2022-10-27 ENCOUNTER — Encounter (HOSPITAL_BASED_OUTPATIENT_CLINIC_OR_DEPARTMENT_OTHER): Payer: Self-pay

## 2022-10-27 DIAGNOSIS — R079 Chest pain, unspecified: Secondary | ICD-10-CM | POA: Insufficient documentation

## 2022-10-27 DIAGNOSIS — Z7982 Long term (current) use of aspirin: Secondary | ICD-10-CM | POA: Insufficient documentation

## 2022-10-27 DIAGNOSIS — R9431 Abnormal electrocardiogram [ECG] [EKG]: Secondary | ICD-10-CM | POA: Insufficient documentation

## 2022-10-27 LAB — COMPREHENSIVE METABOLIC PANEL, NON-FASTING
ALBUMIN/GLOBULIN RATIO: 0.9 (ref 0.8–1.4)
ALBUMIN: 3.7 g/dL (ref 3.4–5.0)
ALKALINE PHOSPHATASE: 91 U/L (ref 46–116)
ALT (SGPT): 46 U/L (ref <=78)
ANION GAP: 9 mmol/L (ref 4–13)
AST (SGOT): 19 U/L (ref 15–37)
BILIRUBIN TOTAL: 0.5 mg/dL (ref 0.2–1.0)
BUN/CREA RATIO: 14
BUN: 20 mg/dL — ABNORMAL HIGH (ref 7–18)
CALCIUM, CORRECTED: 9.8 mg/dL
CALCIUM: 9.6 mg/dL (ref 8.5–10.1)
CHLORIDE: 104 mmol/L (ref 98–107)
CO2 TOTAL: 26 mmol/L (ref 21–32)
CREATININE: 1.46 mg/dL — ABNORMAL HIGH (ref 0.70–1.30)
ESTIMATED GFR: 54 mL/min/{1.73_m2} — ABNORMAL LOW (ref >59)
GLOBULIN: 4
GLUCOSE: 127 mg/dL — ABNORMAL HIGH (ref 74–106)
OSMOLALITY, CALCULATED: 282 mOsm/kg (ref 270–290)
POTASSIUM: 4.2 mmol/L (ref 3.5–5.1)
PROTEIN TOTAL: 7.7 g/dL (ref 6.4–8.2)
SODIUM: 139 mmol/L (ref 136–145)

## 2022-10-27 LAB — CBC WITH DIFF
BASOPHIL #: 0.02 10*3/uL (ref 0.00–0.30)
BASOPHIL %: 0 % (ref 0–3)
EOSINOPHIL #: 0.33 10*3/uL (ref 0.00–0.80)
EOSINOPHIL %: 4 % (ref 0–7)
HCT: 51 % (ref 42.0–51.0)
HGB: 16.4 g/dL (ref 13.5–18.0)
LYMPHOCYTE #: 2.48 10*3/uL (ref 1.10–5.00)
LYMPHOCYTE %: 30 % (ref 25–45)
MCH: 30.4 pg (ref 27.0–32.0)
MCHC: 32.2 g/dL (ref 32.0–36.0)
MCV: 94.3 fL (ref 78.0–99.0)
MONOCYTE #: 0.58 10*3/uL (ref 0.00–1.30)
MONOCYTE %: 7 % (ref 0–12)
MPV: 7.4 fL (ref 7.4–10.4)
NEUTROPHIL #: 4.96 10*3/uL (ref 1.80–8.40)
NEUTROPHIL %: 59 % (ref 40–76)
PLATELETS: 279 10*3/uL (ref 140–440)
RBC: 5.41 10*6/uL (ref 4.20–6.00)
RDW: 15.3 % — ABNORMAL HIGH (ref 11.6–14.8)
WBC: 8.4 10*3/uL (ref 4.0–10.5)

## 2022-10-27 LAB — D-DIMER: D-DIMER: <215 ng/mL DDU — ABNORMAL LOW (ref 215–500)

## 2022-10-27 LAB — PT/INR
INR: 1.03 (ref 0.84–1.10)
PROTHROMBIN TIME: 12.1 seconds (ref 9.8–12.7)

## 2022-10-27 LAB — TROPONIN-I
TROPONIN I: 2 ng/L (ref <20)
TROPONIN I: 2 ng/L (ref <20)

## 2022-10-27 LAB — PTT (PARTIAL THROMBOPLASTIN TIME): APTT: 37 seconds (ref 25.0–38.0)

## 2022-10-27 LAB — LIPASE: LIPASE: 57 U/L (ref 16–77)

## 2022-10-27 MED ORDER — NITROGLYCERIN 2 % TRANSDERMAL OINTMENT - PACKET
0.5000 [in_us] | TOPICAL_OINTMENT | TRANSDERMAL | Status: AC
Start: 2022-10-27 — End: 2022-10-27
  Administered 2022-10-27: 0.5 [in_us] via TOPICAL

## 2022-10-27 MED ORDER — ONDANSETRON HCL (PF) 4 MG/2 ML INJECTION SOLUTION
INTRAMUSCULAR | Status: AC
Start: 2022-10-27 — End: 2022-10-27
  Filled 2022-10-27: qty 2

## 2022-10-27 MED ORDER — MORPHINE 4 MG/ML INJECTION WRAPPER
INJECTION | INTRAMUSCULAR | Status: AC
Start: 2022-10-27 — End: 2022-10-27
  Filled 2022-10-27: qty 1

## 2022-10-27 MED ORDER — PANTOPRAZOLE 40 MG INTRAVENOUS SOLUTION
INTRAVENOUS | Status: AC
Start: 2022-10-27 — End: 2022-10-27
  Filled 2022-10-27: qty 10

## 2022-10-27 MED ORDER — NITROGLYCERIN 2 % TRANSDERMAL OINTMENT - PACKET
TOPICAL_OINTMENT | TRANSDERMAL | Status: AC
Start: 2022-10-27 — End: 2022-10-27
  Filled 2022-10-27: qty 1

## 2022-10-27 MED ORDER — ONDANSETRON HCL (PF) 4 MG/2 ML INJECTION SOLUTION
4.0000 mg | INTRAMUSCULAR | Status: AC
Start: 2022-10-27 — End: 2022-10-27
  Administered 2022-10-27: 4 mg via INTRAVENOUS

## 2022-10-27 MED ORDER — MORPHINE 4 MG/ML INJECTION WRAPPER
4.0000 mg | INJECTION | INTRAMUSCULAR | Status: AC
Start: 2022-10-27 — End: 2022-10-27
  Administered 2022-10-27: 4 mg via INTRAVENOUS

## 2022-10-27 MED ORDER — PANTOPRAZOLE 40 MG INTRAVENOUS SOLUTION
40.0000 mg | INTRAVENOUS | Status: AC
Start: 2022-10-27 — End: 2022-10-27
  Administered 2022-10-27: 40 mg via INTRAVENOUS

## 2022-10-27 NOTE — ED Nurses Note (Addendum)
Pt c/o midsternal chest pain radiating to L arm w/ numbness to L hand, nausea, and intermittent shortness of breath x 1 week. Pt states pain started last Wednesday but no pain on Thursday, states pain started again yesterday after bailing hay and awoke him today. Pt states he took 324 mg aspirin PTA. Pt placed on monitor, VSS, daughter at bedside, call light within reach. Respirations even and unlabored w/ no s/s of acute distress noted at this time.

## 2022-10-27 NOTE — ED Nurses Note (Signed)
Pt states pain has improved but is still present. Respirations even and unlabored w/ no s/s of acute distress noted at this time. Daughter at bedside.

## 2022-10-27 NOTE — ED Triage Notes (Signed)
Pt c/o midsternal chest pain radiating to left side and left arm and hand off and on since last Wednesday. States pain has been consistent since around 10 am this morning with shortness of breath at times. 324 ASA taken by pt PTA

## 2022-10-27 NOTE — ED Provider Notes (Addendum)
[facility] emergency department         HISTORY OF PRESENT ILLNESS     Date:  10/27/2022  Patient's Name:  Scott Bradshaw  Date of Birth:  27-Nov-1960    Patient is a 62 year old with chest pain radiating down both arms for the last week.  Patient takes aspirin daily patient did not have history of coronary artery disease.  He has had negative stress test prior.        Review of Systems     Review of Systems   Constitutional: Negative.    HENT: Negative.     Eyes: Negative.    Respiratory:  Positive for cough, chest tightness and shortness of breath.    Cardiovascular:  Positive for chest pain.   Gastrointestinal: Negative.    Musculoskeletal: Negative.    Neurological: Negative.    Hematological: Negative.    Psychiatric/Behavioral: Negative.     All other systems reviewed and are negative.      Previous History     Past Medical History:  Past Medical History:   Diagnosis Date    Anxiety     Asthma     Cardiac arrhythmia     Depression     Diabetes mellitus, type 2 (CMS HCC)     DOE (dyspnea on exertion)     Esophageal reflux     Hypercholesterolemia     Hypertension     Palpitation     Sarcoidosis     Ulcerative colitis (CMS HCC)        Past Surgical History:  Past Surgical History:   Procedure Laterality Date    Colonoscopy      Hx hernia repair      Hx knee arthroscopy Right     Hx vasectomy      Umbilical hernia repair         Social History:  Social History     Tobacco Use    Smoking status: Never    Smokeless tobacco: Current     Types: Chew, Snuff   Vaping Use    Vaping status: Never Used   Substance Use Topics    Alcohol use: Yes     Alcohol/week: 12.0 standard drinks of alcohol     Types: 12 Cans of beer per week     Comment: socially    Drug use: Never     Social History     Substance and Sexual Activity   Drug Use Never       Family History:  Family History   Problem Relation Age of Onset    Thyroid Cancer Mother     Lung Cancer Mother     Bone cancer Mother      Heart Disease Father     Prostate Cancer Father        Medication History:  Current Outpatient Medications   Medication Sig    acetaminophen-codeine (TYLENOL #3) 300-30 mg Oral Tablet Take 1 Tablet by mouth Every 6 hours as needed    albuterol sulfate (PROVENTIL OR VENTOLIN OR PROAIR) 90 mcg/actuation Inhalation oral inhaler Take 2 Puffs by inhalation Every 4 hours as needed    aspirin (ECOTRIN) 81 mg Oral Tablet, Delayed Release (E.C.) Take 1 Tablet (81 mg total) by mouth Once a day    atenoloL (TENORMIN) 25 mg Oral Tablet Take 1 Tablet (25 mg total) by mouth Once a day    clonazePAM (KLONOPIN) 0.5 mg Oral Tablet Take 1  Tablet (0.5 mg total) by mouth Once per day as needed    ergocalciferol, vitamin D2, (DRISDOL) 1,250 mcg (50,000 unit) Oral Capsule Take 1 Capsule (50,000 Units total) by mouth Every 7 days    esomeprazole magnesium (NEXIUM) 40 mg Oral Capsule, Delayed Release(E.C.) Take 1 Capsule (40 mg total) by mouth Every morning before breakfast    fluticasone-umeclidin-vilanter 200-62.5-25 mcg Inhalation Disk with Device Take 1 INHALATION  by inhalation Once a day    furosemide (LASIX) 40 mg Oral Tablet Take 1 Tablet (40 mg total) by mouth Once a day    HYDROcodone-acetaminophen (NORCO) 5-325 mg Oral Tablet Take 1 Tablet by mouth Every 4 hours as needed for Pain (Patient not taking: Reported on 10/30/2021)    hydrOXYzine pamoate (VISTARIL) 25 mg Oral Capsule Take 1 Capsule (25 mg total) by mouth Three times a day as needed    Ibuprofen (MOTRIN) 800 mg Oral Tablet Take 1 Tablet (800 mg total) by mouth Three times a day as needed for Pain    insulin degludec 100 unit/mL (3 mL) Subcutaneous Insulin Pen Inject 20 Units under the skin Every evening    ipratropium-albuterol 0.5 mg-3 mg(2.5 mg base)/3 mL Solution for Nebulization Take 3 mL by nebulization Four times a day as needed    loratadine (CLARITIN) 10 mg Oral Tablet Take 1 Tablet (10 mg total) by mouth Once a day    LORazepam (ATIVAN) 0.5 mg Oral Tablet Take 1  Tablet (0.5 mg total) by mouth Once per day as needed for Anxiety    montelukast (SINGULAIR) 10 mg Oral Tablet Take 1 Tablet (10 mg total) by mouth Once a day    oxygen (O2) Inhalation gas 2 L/min Every evening    potassium chloride (K-DUR) 20 mEq Oral Tab Sust.Rel. Particle/Crystal Take 1 Tablet (20 mEq total) by mouth Once a day    testosterone 1.62 % (20.25 mg/1.25 gram) Transdermal Gel in Packet Place 2 Sprays on the skin Once a day    tirzepatide (MOUNJARO) 5 mg/0.5 mL Subcutaneous Pen Injector Inject 0.5 mL (5 mg total) under the skin Every 7 days    tiZANidine (ZANAFLEX) 4 mg Oral Tablet Take 1 Tablet (4 mg total) by mouth Three times a day    vilazodone (VIIBRYD) 40 mg Oral Tablet Take 1 Tablet (40 mg total) by mouth Once a day       Allergies:  Allergies   Allergen Reactions    Penicillins Hives/ Urticaria       Physical Exam     Vitals:    BP 110/82   Pulse 79   Temp 37.1 C (98.7 F)   Resp 14   Ht 1.829 m (6')   Wt 108 kg (238 lb)   SpO2 95%   BMI 32.28 kg/m           Physical Exam  Vitals and nursing note reviewed.   Constitutional:       General: He is not in acute distress.     Appearance: Normal appearance. He is well-developed.   HENT:      Head: Normocephalic and atraumatic.      Right Ear: Tympanic membrane normal.      Left Ear: Tympanic membrane normal.      Nose: Nose normal.      Mouth/Throat:      Mouth: Mucous membranes are moist.   Eyes:      Extraocular Movements: Extraocular movements intact.      Conjunctiva/sclera: Conjunctivae normal.  Pupils: Pupils are equal, round, and reactive to light.   Cardiovascular:      Rate and Rhythm: Normal rate and regular rhythm.      Heart sounds: No murmur heard.  Pulmonary:      Effort: Pulmonary effort is normal. No respiratory distress.      Breath sounds: Normal breath sounds.   Abdominal:      General: Bowel sounds are normal.      Palpations: Abdomen is soft.      Tenderness: There is no abdominal tenderness.   Musculoskeletal:          General: No swelling. Normal range of motion.      Cervical back: Normal range of motion and neck supple.   Skin:     General: Skin is warm and dry.      Capillary Refill: Capillary refill takes less than 2 seconds.   Neurological:      General: No focal deficit present.      Mental Status: He is alert and oriented to person, place, and time.   Psychiatric:         Mood and Affect: Mood normal.         Diagnostic Studies/Treatment     Medications:  Medications Administered in the ED   pantoprazole (PROTONIX) injection (40 mg Intravenous Given 10/27/22 2123)   ondansetron (ZOFRAN) 2 mg/mL injection (4 mg Intravenous Given 10/27/22 2122)   morphine 4 mg/mL injection (4 mg Intravenous Given 10/27/22 2123)   nitroGLYCERIN (NITRO-BID) 2 % topical ointment (0.5 Inches Apply Topically Given 10/27/22 2122)       New Prescriptions    No medications on file       Labs:    Results for orders placed or performed during the hospital encounter of 10/27/22 (from the past 12 hour(s))   COMPREHENSIVE METABOLIC PANEL, NON-FASTING   Result Value Ref Range    SODIUM 139 136 - 145 mmol/L    POTASSIUM 4.2 3.5 - 5.1 mmol/L    CHLORIDE 104 98 - 107 mmol/L    CO2 TOTAL 26 21 - 32 mmol/L    ANION GAP 9 4 - 13 mmol/L    BUN 20 (H) 7 - 18 mg/dL    CREATININE 4.54 (H) 0.70 - 1.30 mg/dL    BUN/CREA RATIO 14     ESTIMATED GFR 54 (L) >59 mL/min/1.94m^2    ALBUMIN 3.7 3.4 - 5.0 g/dL    CALCIUM 9.6 8.5 - 09.8 mg/dL    GLUCOSE 119 (H) 74 - 106 mg/dL    ALKALINE PHOSPHATASE 91 46 - 116 U/L    ALT (SGPT) 46 <=78 U/L    AST (SGOT) 19 15 - 37 U/L    BILIRUBIN TOTAL 0.5 0.2 - 1.0 mg/dL    PROTEIN TOTAL 7.7 6.4 - 8.2 g/dL    ALBUMIN/GLOBULIN RATIO 0.9 0.8 - 1.4    OSMOLALITY, CALCULATED 282 270 - 290 mOsm/kg    CALCIUM, CORRECTED 9.8 mg/dL    GLOBULIN 4.0    D-DIMER   Result Value Ref Range    D-DIMER <215 (L) 215 - 500 ng/mL DDU   LIPASE   Result Value Ref Range    LIPASE 57 16 - 77 U/L   PT/INR   Result Value Ref Range    PROTHROMBIN TIME 12.1 9.8 - 12.7  seconds    INR 1.03 0.84 - 1.10   PTT (PARTIAL THROMBOPLASTIN TIME)   Result Value Ref Range    APTT 37.0 25.0 -  38.0 seconds   TROPONIN NOW   Result Value Ref Range    TROPONIN I 2 <20 ng/L   CBC WITH DIFF   Result Value Ref Range    WBC 8.4 4.0 - 10.5 x10^3/uL    RBC 5.41 4.20 - 6.00 x10^6/uL    HGB 16.4 13.5 - 18.0 g/dL    HCT 16.1 09.6 - 04.5 %    MCV 94.3 78.0 - 99.0 fL    MCH 30.4 27.0 - 32.0 pg    MCHC 32.2 32.0 - 36.0 g/dL    RDW 40.9 (H) 81.1 - 14.8 %    PLATELETS 279 140 - 440 x10^3/uL    MPV 7.4 7.4 - 10.4 fL    NEUTROPHIL % 59 40 - 76 %    LYMPHOCYTE % 30 25 - 45 %    MONOCYTE % 7 0 - 12 %    EOSINOPHIL % 4 0 - 7 %    BASOPHIL % 0 0 - 3 %    NEUTROPHIL # 4.96 1.80 - 8.40 x10^3/uL    LYMPHOCYTE # 2.48 1.10 - 5.00 x10^3/uL    MONOCYTE # 0.58 0.00 - 1.30 x10^3/uL    EOSINOPHIL # 0.33 0.00 - 0.80 x10^3/uL    BASOPHIL # 0.02 0.00 - 0.30 x10^3/uL   GRAY TOP TUBE   Result Value Ref Range    RAINBOW/EXTRA TUBE AUTO RESULT Yes    TROPONIN IN ONE HOUR   Result Value Ref Range    TROPONIN I 2 <20 ng/L   TROPONIN IN THREE HOURS   Result Value Ref Range    TROPONIN I <2 <20 ng/L        Radiology:  XR CHEST AP    XR CHEST AP   Final Result   NO ACUTE FINDINGS.            Radiologist location ID: [id number]             ECG:     No has rhythm 87 beats per minute nonspecific ST-T changes QRS duration 86          Differential diagnosis  Chest pain, acute myocardial infarction, gastroesophageal reflux    Course/Disposition/Plan     Course:     ED Course as of 10/28/22 0035   Sun Oct 28, 2022   0032 TROPONIN NEGATIVE X3 NONCARDIAC CHEST PAIN.  PATIENT WILL BE DISCHARGED HOME FOLLOW-UP WITH BODY WOULD YOU OUTPATIENT   Patient with one-week history of chest pain radiating down left arm midsternal sharp in nature will have serial troponin.    Disposition:    Discharged    Condition at Disposition:     Stable  Follow up:   Wyonia Hough, MD  [address]  [place] [address]  [place] [address]  [phone]    Schedule an  appointment as soon as possible for a visit in 3 days  PAIN      Clinical Impression:     Clinical Impression   Chest pain, unspecified type (Primary)         Tivis Ringer, MD

## 2022-10-28 LAB — GRAY TOP TUBE

## 2022-10-28 LAB — TROPONIN-I: TROPONIN I: <2 ng/L (ref <20)

## 2022-10-28 MED ORDER — HYDROCODONE 5 MG-ACETAMINOPHEN 325 MG TABLET
2.0000 | ORAL_TABLET | ORAL | Status: AC
Start: 2022-10-28 — End: 2022-10-28
  Administered 2022-10-28: 2 via ORAL

## 2022-10-28 MED ORDER — HYDROCODONE 5 MG-ACETAMINOPHEN 325 MG TABLET
ORAL_TABLET | ORAL | Status: AC
Start: 2022-10-28 — End: 2022-10-28
  Filled 2022-10-28: qty 2

## 2022-10-28 NOTE — ED Nurses Note (Signed)
Patient discharged home with family.  AVS reviewed with patient/care giver.  A written copy of the AVS and discharge instructions was given to the patient/care giver. Scripts handed to patient/care giver. Questions sufficiently answered as needed.  Patient/care giver encouraged to follow up with PCP as indicated.  In the event of an emergency, patient/care giver instructed to call 911 or go to the nearest emergency room. Pt verbalized understanding of discharge instructions. Respirations even and unlabored w/ no s/s of acute distress noted at this time. Pt left w/ steady gait w/ daughter.

## 2022-10-29 DIAGNOSIS — R9431 Abnormal electrocardiogram [ECG] [EKG]: Secondary | ICD-10-CM

## 2022-10-29 DIAGNOSIS — R079 Chest pain, unspecified: Secondary | ICD-10-CM

## 2022-10-29 LAB — ECG 12 LEAD
Atrial Rate: 87 {beats}/min
Calculated P Axis: 52 degrees
Calculated R Axis: -10 degrees
Calculated T Axis: 94 degrees
PR Interval: 134 ms
QRS Duration: 86 ms
QT Interval: 344 ms
QTC Calculation: 413 ms
Ventricular rate: 87 {beats}/min

## 2022-10-31 ENCOUNTER — Encounter (INDEPENDENT_AMBULATORY_CARE_PROVIDER_SITE_OTHER): Payer: Self-pay | Admitting: NURSE PRACTITIONER

## 2022-10-31 ENCOUNTER — Encounter (INDEPENDENT_AMBULATORY_CARE_PROVIDER_SITE_OTHER): Payer: Self-pay

## 2023-02-07 ENCOUNTER — Other Ambulatory Visit: Payer: Self-pay

## 2023-02-07 ENCOUNTER — Encounter (INDEPENDENT_AMBULATORY_CARE_PROVIDER_SITE_OTHER): Payer: Self-pay | Admitting: NURSE PRACTITIONER

## 2023-02-07 ENCOUNTER — Other Ambulatory Visit (HOSPITAL_COMMUNITY): Payer: 59

## 2023-02-07 ENCOUNTER — Ambulatory Visit: Payer: 59 | Attending: NURSE PRACTITIONER | Admitting: NURSE PRACTITIONER

## 2023-02-07 VITALS — Ht 72.0 in | Wt 240.0 lb

## 2023-02-07 DIAGNOSIS — H9313 Tinnitus, bilateral: Secondary | ICD-10-CM

## 2023-02-07 DIAGNOSIS — E119 Type 2 diabetes mellitus without complications: Secondary | ICD-10-CM | POA: Insufficient documentation

## 2023-02-07 DIAGNOSIS — H903 Sensorineural hearing loss, bilateral: Secondary | ICD-10-CM | POA: Insufficient documentation

## 2023-02-07 DIAGNOSIS — H9311 Tinnitus, right ear: Secondary | ICD-10-CM | POA: Insufficient documentation

## 2023-02-07 LAB — CREATININE WITH EGFR
CREATININE: 1.31 mg/dL — ABNORMAL HIGH (ref 0.60–1.30)
ESTIMATED GFR: 62 mL/min/{1.73_m2} (ref >59)

## 2023-03-05 IMAGING — MR MRI BRAIN W/ IAC W & W/O CONTRAST
10 of 13 series · 36 of 48 positions shown · IV contrast (gadavist)
Comparison: None previous available.

﻿EXAM:  10886   MRI BRAIN W/ IAC W & W/O CONTRAST
INDICATION: 62-year-old with acute onset of tinnitus in the right ear.  Asymmetric hearing loss. Headache.
TECHNIQUE: MRI brain and thin section images of internal auditory canal were performed including postcontrast series after administration of 10 mL Gadavist IV.

[Series 6: DWI · axial · 6.0mm · 1.35mm/px · z∈[-78,+83]mm · 11 of 96 slices shown (1 of 3)]
[im 1/96]
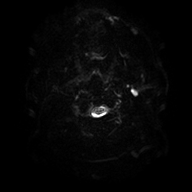
[im 10/96]
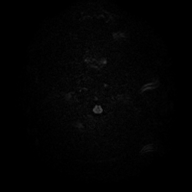
[im 20/96]
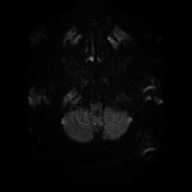
[im 29/96]
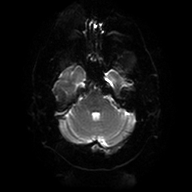
[im 39/96]
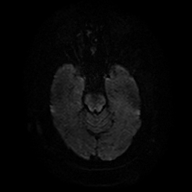
[im 48/96]
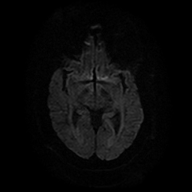
[im 58/96]
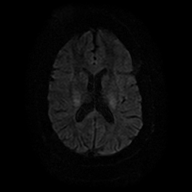
[im 67/96]
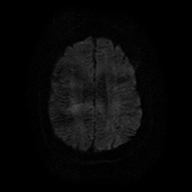
[im 77/96]
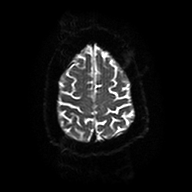
[im 86/96]
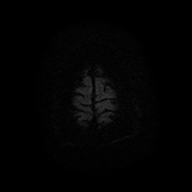
[im 96/96]
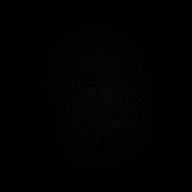

[Series 7: DWI · axial · 6.0mm · 1.35mm/px · z∈[-78,+83]mm · 3 of 24 slices shown (2 of 3)]
[im 1/24]
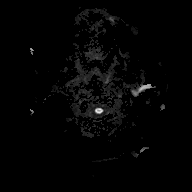
[im 12/24]
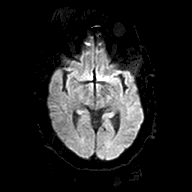
[im 24/24]
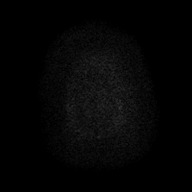

[Series 8: DWI · axial · 6.0mm · 1.35mm/px · z∈[-78,+83]mm · 3 of 23 slices shown (3 of 3)]
[im 1/23]
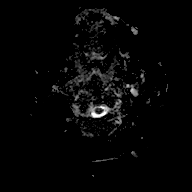
[im 12/23]
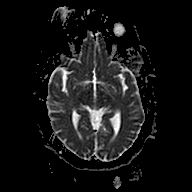
[im 23/23]
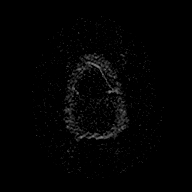

[Series 9: FLAIR · sagittal · 5.0mm · 0.75mm/px · 3 of 26 slices shown (1 of 2)]
[im 1/26]
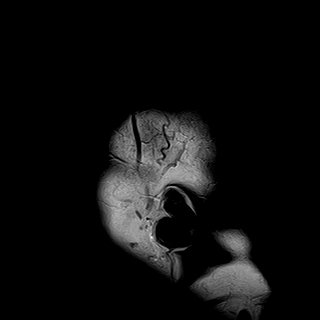
[im 13/26]
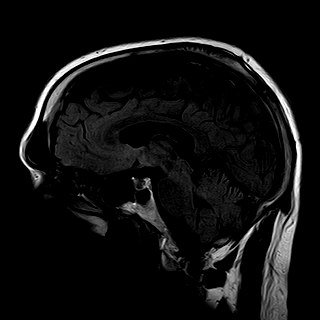
[im 26/26]
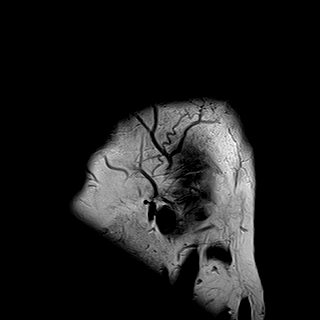

[Series 10: T2 · axial · 4.8mm · 0.47mm/px · z∈[-84,+84]mm · 4 of 36 slices shown]
[im 1/36]
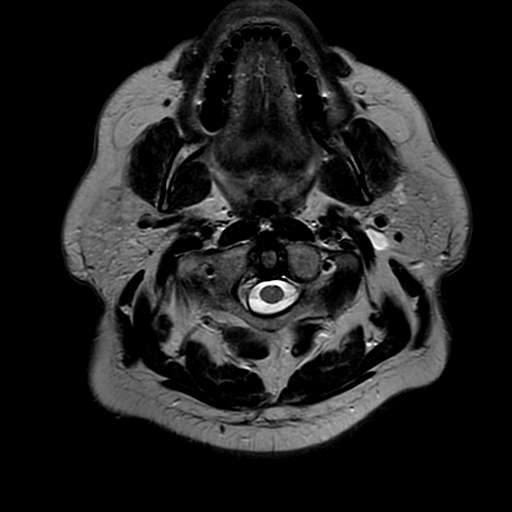
[im 12/36]
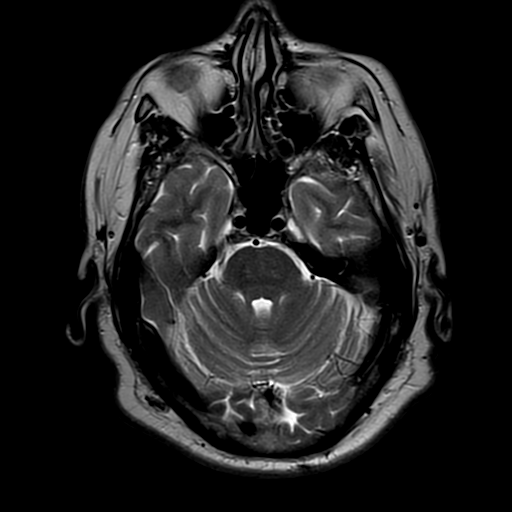
[im 24/36]
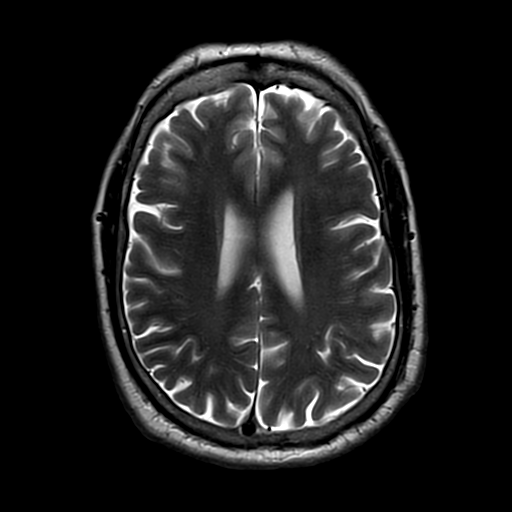
[im 36/36]
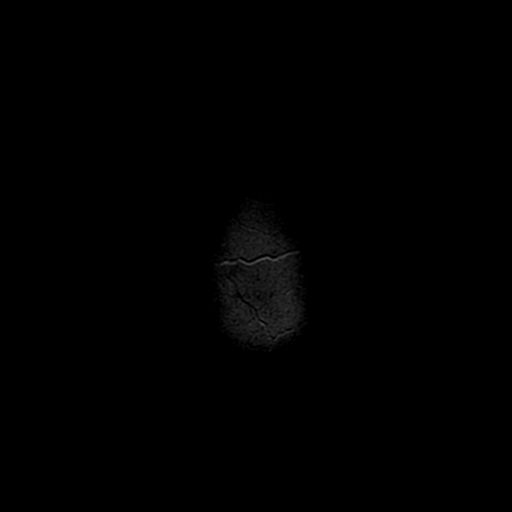

[Series 11: FLAIR · axial · 4.8mm · 0.47mm/px · z∈[-84,+84]mm · 4 of 36 slices shown (2 of 2)]
[im 1/36]
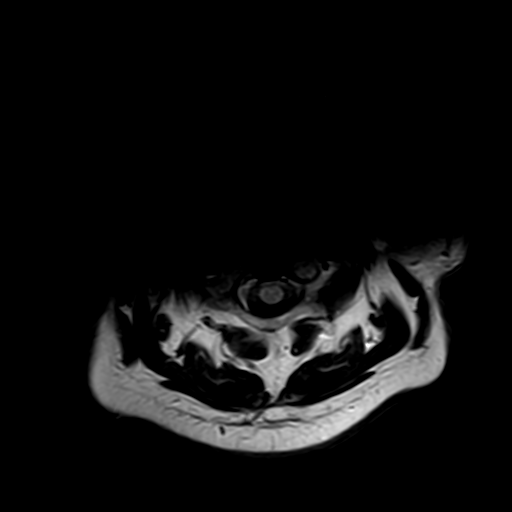
[im 12/36]
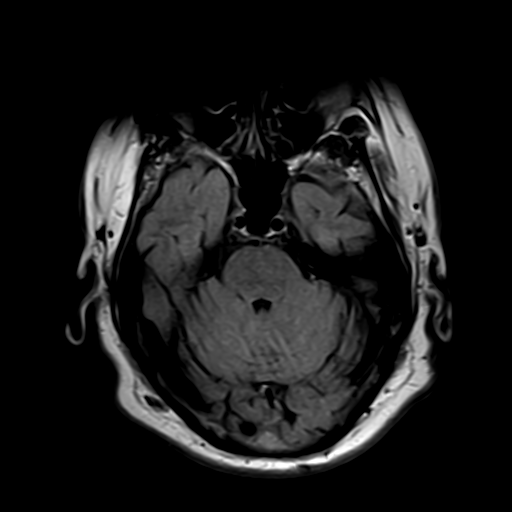
[im 24/36]
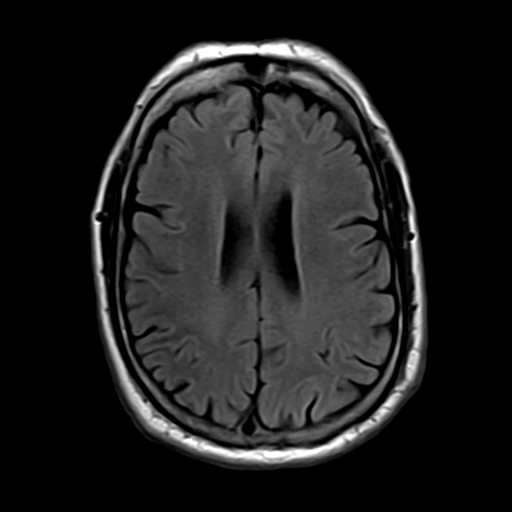
[im 36/36]
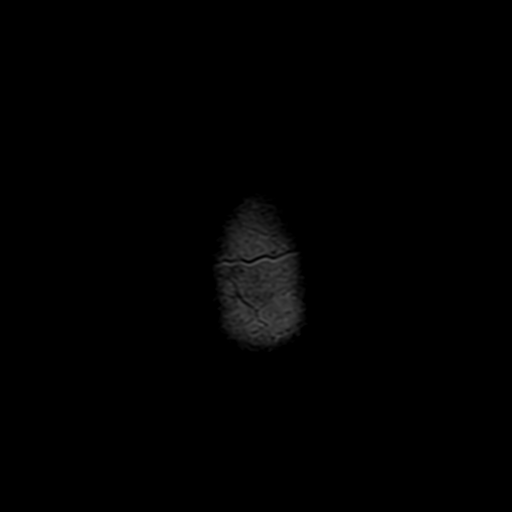

[Series 12: T1 · axial · 4.8mm · 0.47mm/px · z∈[-84,-31]mm · 2 of 36 slices shown]
[im 1/36]
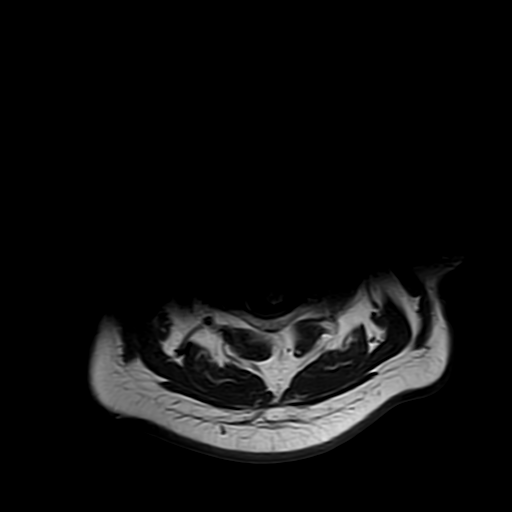
[im 12/36]
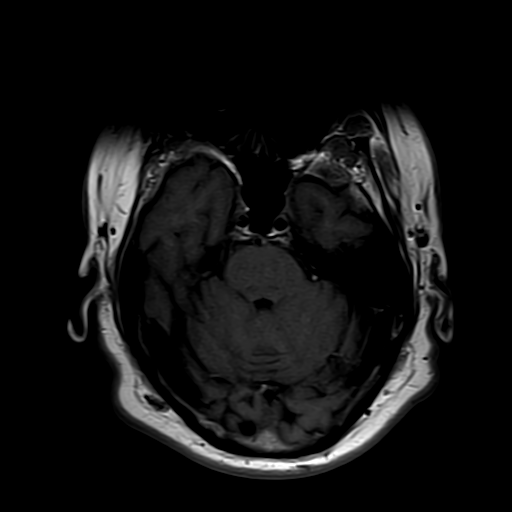

[Series 16: T1 post-contrast · axial · 3.0mm · 0.47mm/px · 1 of 11 slices shown (1 of 3)]
[im 1/11]
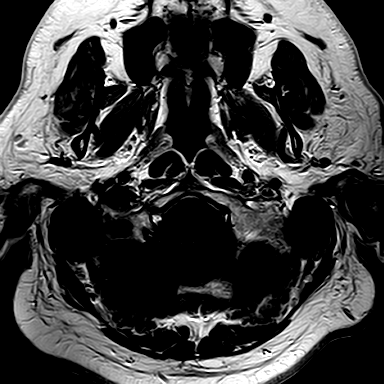

[Series 17: T1 post-contrast · coronal · 3.0mm · 0.47mm/px · 1 of 11 slices shown (2 of 3)]
[im 1/11]
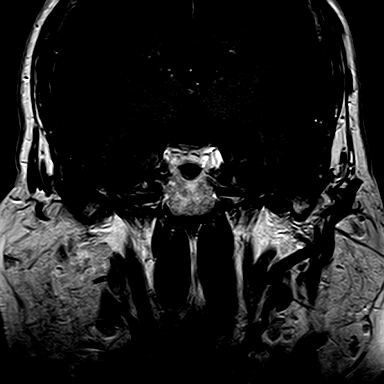

[Series 18: T1 post-contrast · axial · 4.8mm · 0.47mm/px · z∈[-84,+84]mm · 4 of 36 slices shown (3 of 3)]
[im 1/36]
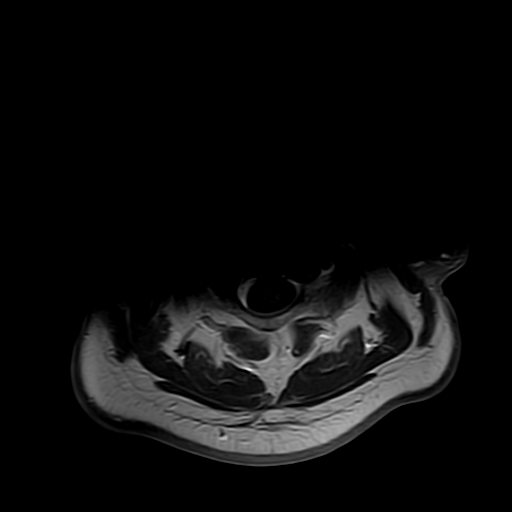
[im 12/36]
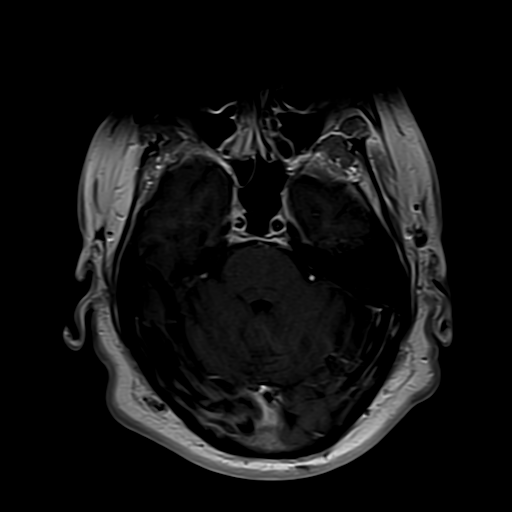
[im 24/36]
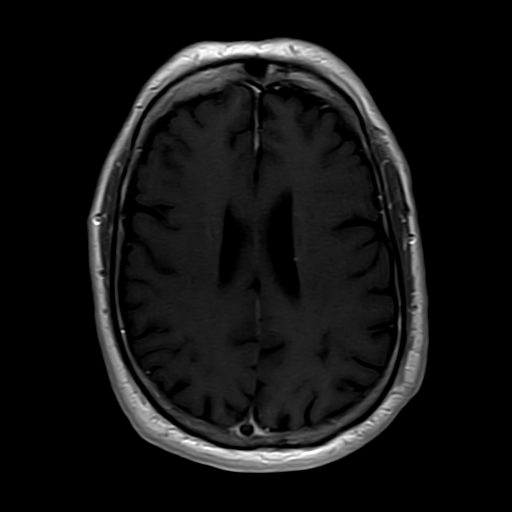
[im 36/36]
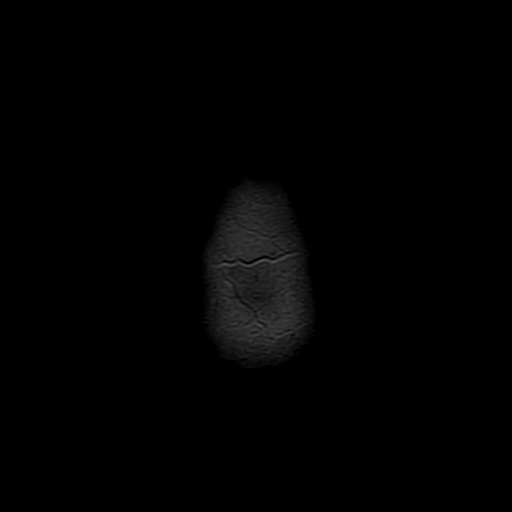

[36 of 48 positions shown; findings below may reference images not displayed]

FINDINGS: No focal areas of restricted diffusion.  No intracranial bleed or extra-axial collections are seen.  No evidence of demyelinating lesions are seen on the FLAIR images.  No ventriculomegaly or midline shift. 

Major arteries of circle of Willis and dural venous sinuses are patent.  No space-occupying lesions are seen at the cerebellopontine angles and internal auditory canals.  No abnormal enhancement is noted on postcontrast examination.  Especially, no abnormal enhancement within the internal auditory canals and at the cerebellopontine angles.  Pituitary gland is normal in appearance.

Mild bilateral ethmoid sinusitis. Mastoid cells are clear.
IMPRESSION: 1. No evidence of acute ischemia, chronic ischemia or demyelinating lesions of the brain.

2. No space-occupying lesions of the brain. Especially, no focal lesions at the cerebellopontine angles and internal auditory canals. No abnormal enhancement on postcontrast study.

3. Mastoid cells are clear on both sides.  Mild bilateral ethmoid sinusitis.  

Electronically Signed by BJORK, IVETH at 29-[id number]-BABB [DATE]

## 2023-03-06 ENCOUNTER — Other Ambulatory Visit (INDEPENDENT_AMBULATORY_CARE_PROVIDER_SITE_OTHER): Payer: Self-pay | Admitting: NURSE PRACTITIONER

## 2023-03-14 ENCOUNTER — Encounter (HOSPITAL_BASED_OUTPATIENT_CLINIC_OR_DEPARTMENT_OTHER): Payer: Self-pay

## 2023-03-14 ENCOUNTER — Emergency Department (HOSPITAL_BASED_OUTPATIENT_CLINIC_OR_DEPARTMENT_OTHER): Payer: 59

## 2023-03-14 ENCOUNTER — Other Ambulatory Visit: Payer: Self-pay

## 2023-03-14 ENCOUNTER — Emergency Department
Admission: EM | Admit: 2023-03-14 | Discharge: 2023-03-15 | Disposition: A | Discharge status: Home / Self Care | Payer: 59 | Attending: Family | Admitting: Family

## 2023-03-14 DIAGNOSIS — X500XXA Overexertion from strenuous movement or load, initial encounter: Secondary | ICD-10-CM | POA: Insufficient documentation

## 2023-03-14 DIAGNOSIS — M4803 Spinal stenosis, cervicothoracic region: Secondary | ICD-10-CM | POA: Insufficient documentation

## 2023-03-14 DIAGNOSIS — T148XXA Other injury of unspecified body region, initial encounter: Secondary | ICD-10-CM

## 2023-03-14 DIAGNOSIS — M4802 Spinal stenosis, cervical region: Secondary | ICD-10-CM | POA: Insufficient documentation

## 2023-03-14 DIAGNOSIS — M5414 Radiculopathy, thoracic region: Secondary | ICD-10-CM | POA: Insufficient documentation

## 2023-03-14 DIAGNOSIS — S29012A Strain of muscle and tendon of back wall of thorax, initial encounter: Secondary | ICD-10-CM | POA: Insufficient documentation

## 2023-03-14 DIAGNOSIS — M2578 Osteophyte, vertebrae: Secondary | ICD-10-CM | POA: Insufficient documentation

## 2023-03-14 DIAGNOSIS — M5412 Radiculopathy, cervical region: Secondary | ICD-10-CM | POA: Insufficient documentation

## 2023-03-14 LAB — COMPREHENSIVE METABOLIC PANEL, NON-FASTING
ALBUMIN/GLOBULIN RATIO: 1.2 (ref 0.8–1.4)
ALBUMIN: 3.8 g/dL (ref 3.4–5.0)
ALKALINE PHOSPHATASE: 85 U/L (ref 46–116)
ALT (SGPT): 36 U/L (ref <=78)
ANION GAP: 11 mmol/L (ref 4–13)
AST (SGOT): 23 U/L (ref 15–37)
BILIRUBIN TOTAL: 0.6 mg/dL (ref 0.2–1.0)
BUN/CREA RATIO: 14
BUN: 20 mg/dL — ABNORMAL HIGH (ref 7–18)
CALCIUM, CORRECTED: 9.4 mg/dL
CALCIUM: 9.2 mg/dL (ref 8.5–10.1)
CHLORIDE: 104 mmol/L (ref 98–107)
CO2 TOTAL: 24 mmol/L (ref 21–32)
CREATININE: 1.48 mg/dL — ABNORMAL HIGH (ref 0.70–1.30)
ESTIMATED GFR: 53 mL/min/{1.73_m2} — ABNORMAL LOW (ref >59)
GLOBULIN: 3.3
GLUCOSE: 126 mg/dL — ABNORMAL HIGH (ref 74–106)
OSMOLALITY, CALCULATED: 282 mosm/kg (ref 270–290)
POTASSIUM: 4 mmol/L (ref 3.5–5.1)
PROTEIN TOTAL: 7.1 g/dL (ref 6.4–8.2)
SODIUM: 139 mmol/L (ref 136–145)

## 2023-03-14 LAB — URINALYSIS, MACRO/MICRO
BILIRUBIN: NEGATIVE mg/dL
BLOOD: NEGATIVE mg/dL
GLUCOSE: NEGATIVE mg/dL
KETONES: NEGATIVE mg/dL
LEUKOCYTES: NEGATIVE WBCs/uL
NITRITE: NEGATIVE
PH: 7.5 (ref 4.6–8.0)
PROTEIN: NEGATIVE mg/dL
SPECIFIC GRAVITY: 1.015 (ref 1.003–1.035)
UROBILINOGEN: 0.2 mg/dL (ref 0.2–1.0)

## 2023-03-14 LAB — CBC WITH DIFF
BASOPHIL #: 0.03 10*3/uL (ref 0.00–0.30)
BASOPHIL %: 0 % (ref 0–3)
EOSINOPHIL #: 0.33 10*3/uL (ref 0.00–0.80)
EOSINOPHIL %: 4 % (ref 1–8)
HCT: 44.9 % (ref 42.0–51.0)
HGB: 15.2 g/dL (ref 13.5–18.0)
LYMPHOCYTE #: 2.87 10*3/uL (ref 1.10–5.00)
LYMPHOCYTE %: 31 % (ref 25–45)
MCH: 31.8 pg (ref 27.0–32.0)
MCHC: 33.8 g/dL (ref 32.0–36.0)
MCV: 94.1 fL (ref 78.0–99.0)
MONOCYTE #: 0.8 10*3/uL (ref 0.00–1.30)
MONOCYTE %: 9 % (ref 0–12)
MPV: 7.5 fL (ref 7.4–10.4)
NEUTROPHIL #: 5.24 10*3/uL (ref 1.80–8.40)
NEUTROPHIL %: 57 % (ref 40–76)
PLATELETS: 200 10*3/uL (ref 140–440)
RBC: 4.77 10*6/uL (ref 4.20–6.00)
RDW: 15 % — ABNORMAL HIGH (ref 11.6–14.8)
WBC: 9.3 10*3/uL (ref 4.0–10.5)

## 2023-03-14 LAB — CREATINE KINASE (CK), TOTAL, SERUM OR PLASMA: CREATINE KINASE: 93 U/L (ref 39–308)

## 2023-03-14 MED ORDER — MORPHINE 4 MG/ML INJECTION WRAPPER
INJECTION | INTRAMUSCULAR | Status: AC
Start: 2023-03-14 — End: 2023-03-14
  Filled 2023-03-14: qty 1

## 2023-03-14 MED ORDER — KETOROLAC 30 MG/ML (1 ML) INJECTION SOLUTION
INTRAMUSCULAR | Status: AC
Start: 2023-03-14 — End: 2023-03-14
  Filled 2023-03-14: qty 1

## 2023-03-14 MED ORDER — SODIUM CHLORIDE 0.9 % (FLUSH) INJECTION SYRINGE
3.0000 mL | INJECTION | INTRAMUSCULAR | Status: DC | PRN
Start: 2023-03-14 — End: 2023-03-15

## 2023-03-14 MED ORDER — ONDANSETRON HCL (PF) 4 MG/2 ML INJECTION SOLUTION
4.0000 mg | INTRAMUSCULAR | Status: AC
Start: 2023-03-14 — End: 2023-03-14
  Administered 2023-03-14: 4 mg via INTRAVENOUS

## 2023-03-14 MED ORDER — KETOROLAC 30 MG/ML (1 ML) INJECTION SOLUTION
30.0000 mg | INTRAMUSCULAR | Status: AC
Start: 2023-03-14 — End: 2023-03-14
  Administered 2023-03-14: 30 mg via INTRAVENOUS

## 2023-03-14 MED ORDER — MORPHINE 4 MG/ML INJECTION WRAPPER
4.0000 mg | INJECTION | INTRAMUSCULAR | Status: AC
Start: 2023-03-14 — End: 2023-03-14
  Administered 2023-03-14: 4 mg via INTRAVENOUS

## 2023-03-14 MED ORDER — SODIUM CHLORIDE 0.9 % (FLUSH) INJECTION SYRINGE
3.0000 mL | INJECTION | Freq: Three times a day (TID) | INTRAMUSCULAR | Status: DC
Start: 2023-03-14 — End: 2023-03-15

## 2023-03-14 MED ORDER — ONDANSETRON HCL (PF) 4 MG/2 ML INJECTION SOLUTION
INTRAMUSCULAR | Status: AC
Start: 2023-03-14 — End: 2023-03-14
  Filled 2023-03-14: qty 2

## 2023-03-14 MED ORDER — SODIUM CHLORIDE 0.9 % IV BOLUS
1000.0000 mL | INJECTION | Status: DC
Start: 2023-03-14 — End: 2023-03-15

## 2023-03-14 NOTE — ED Provider Notes (Signed)
[facility], [facility] - Emergency Department  ED Primary Provider Note  History of Present Illness   No chief complaint on file.    Arrival: The patient arrived by Car  Scott Bradshaw is a 62 y.o. male who had no chief complaint listed for this encounter. Pt states left shoulder has hurt for months had cardiac work up told no concerning findings may be a pinched nerve . Told needs mri. States 3 to 4 days ago was lifting heavy post severe left flank pain felt a pop or tear radiates to abd.thinks he ripped a muscle. Hx of umbilical hernia.   Review of Systems   Constitutional: No fever, chills or weakness   Skin: No rash or diaphoresis  HENT: No headaches, or congestion  Eyes: No vision changes or photophobia   Cardio: No chest pain, palpitations or leg swelling   Respiratory: No cough, wheezing or SOB  GI:  No nausea, vomiting or stool changes  GU:  No dysuria, hematuria, or increased frequency+ flank pain   MSK: No muscle aches, joint + neck,  back pain  Neuro: No seizures, LOC, numbness, tingling, or focal weakness  Psychiatric: No depression, SI or substance abuse  All other systems reviewed and are negative.    History Reviewed This Encounter: all noted and reviewed.     Physical Exam   ED Triage Vitals   BP    Pulse    Resp    Temp    SpO2    Weight    Height        Constitutional:  62 y.o. male who appears in no distress. Normal color, no cyanosis.   HENT:   Head: Normocephalic and atraumatic.   Mouth/Throat: Oropharynx is clear and moist.   Eyes: EOMI, PERRL   Neck: Trachea midline. Neck supple.  Cardiovascular: RRR, No murmurs, rubs or gallops. Intact distal pulses.  Pulmonary/Chest: BS equal bilaterally. No respiratory distress. No wheezes, rales or chest tenderness.   Abdominal: Bowel sounds present and normal. Abdomen soft, no tenderness, no rebound and no guarding.  Back: No midline spinal tenderness, + left neck and LS  paraspinal tenderness, no CVA tenderness.            Musculoskeletal: No edema, tenderness or deformity.  Skin: warm and dry. No rash, erythema, pallor or cyanosis  Psychiatric: normal mood and affect. Behavior is normal.   Neurological: Patient keenly alert and responsive, easily able to raise eyebrows, facial muscles/expressions symmetric, speaking in fluent sentences, moving all extremities equally and fully, normal gait  Patient Data   {Click here to open the ED Workup Activity for clinical data review *This link will automatically disappear upon signing your note*:123}Labs Ordered/Reviewed - No data to display  No orders to display     Medical Decision Making   Diff dx of pinched nerve , torn muscle herniated disc.             ED clinical impression missing, please click on the following link to add Clinical Impressions ***  then refresh the note prior to signing.    Disposition: Data Unavailable

## 2023-03-14 NOTE — ED Nurses Note (Signed)
Patient complaining of left sided back and abdominal pain. Patient states that last weekend he was working and lifted something and injured his back, reporting that "it feels like someone put a knife between my shoulder blades," and states that there is a sharp pain. Patient reports that earlier today he started having pain in the left side of his back and it started radiating into the left side of his abdomen. No further concerns or complaints at this time.

## 2023-03-14 NOTE — ED Triage Notes (Addendum)
Patient states that flank pain began about a couple weeks intermittently, this evening the pain got worse and did not subside,patient rates pain 7/10. Patient took Advil today @1400 

## 2023-03-15 MED ORDER — HYDROCODONE 5 MG-ACETAMINOPHEN 325 MG TABLET
ORAL_TABLET | ORAL | Status: AC
Start: 2023-03-15 — End: 2023-03-15
  Filled 2023-03-15: qty 1

## 2023-03-15 MED ORDER — HYDROCODONE 7.5 MG-ACETAMINOPHEN 325 MG TABLET
1.0000 | ORAL_TABLET | Freq: Four times a day (QID) | ORAL | 0 refills | Status: AC | PRN
Start: 2023-03-15 — End: 2023-03-18

## 2023-03-15 MED ORDER — LIDOCAINE 4 % TOPICAL PATCH
1.0000 | MEDICATED_PATCH | Freq: Every day | CUTANEOUS | 0 refills | Status: AC
Start: 2023-03-15 — End: ?

## 2023-03-15 MED ORDER — HYDROCODONE 5 MG-ACETAMINOPHEN 325 MG TABLET
2.0000 | ORAL_TABLET | ORAL | Status: AC
Start: 2023-03-15 — End: 2023-03-15
  Administered 2023-03-15: 2 via ORAL

## 2023-03-15 NOTE — Discharge Instructions (Signed)
Recommend mri asap.

## 2023-03-15 NOTE — ED Notes (Signed)
[facility], [facility] - Emergency Department  Peer Recovery Coach Assessment    Initial Evaluation  Referred by:: Nurse  Location of Evaluation: Emergency Department  How many times in the last 12 months have you been to the ED?: 3  Have you ever served or are you currently serving in the Armed Forces?: No             Substance Use History  Patient current substance use status: Patient sttaed he drinks alcohol daily to every other day and will only have a couple of drinks.    Prior treatment history?: No    Currently enrolled in substance use program?: No    Within the last 30 days, what substances has the patient used?: Alcohol  Patient's age at first substance use?: [age]  Drug route of administration: Oral    Has the patient ever had sustained abstinence?: No              Family, Social, Home & Safety History  Marital Status: Married            Need to improve relationships with family?: No    Social network: Immediate family, Substance using peers, Non-substance using peers/friends/other    Current living situation: With family  Any help needed with the following?: None  Contact phone number for the patient: [phone]       Has the patient had any legal issues within the past 30 days?: None         Employment  Current employment status: Employed  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 Patient needs assessment for bup treatment.: No (non opioid user)    Plan  Was the patient referred to treatment?: No    Was patient referred to physician for Buprenorphine  Assessment in the ED?: No (non opiid user)    Did patient receive Narcan in the ED?: No         Follow-up  Patient admitted for treatment?: No        Need for additional follow-up?: No  Additional comments: Harm reduction and long-term affects were discussed.    Haynes Bast, Peer Recovery Coach 03/15/2023 08:41

## 2023-03-20 ENCOUNTER — Ambulatory Visit (INDEPENDENT_AMBULATORY_CARE_PROVIDER_SITE_OTHER): Payer: Self-pay | Admitting: NURSE PRACTITIONER

## 2023-03-21 ENCOUNTER — Ambulatory Visit (INDEPENDENT_AMBULATORY_CARE_PROVIDER_SITE_OTHER): Payer: Self-pay | Admitting: NURSE PRACTITIONER

## 2023-04-10 ENCOUNTER — Other Ambulatory Visit: Payer: Self-pay

## 2023-04-10 ENCOUNTER — Encounter (INDEPENDENT_AMBULATORY_CARE_PROVIDER_SITE_OTHER): Payer: Self-pay | Admitting: NURSE PRACTITIONER

## 2023-04-10 ENCOUNTER — Ambulatory Visit: Payer: 59 | Attending: NURSE PRACTITIONER | Admitting: NURSE PRACTITIONER

## 2023-04-10 VITALS — Ht 72.0 in | Wt 241.0 lb

## 2023-04-10 DIAGNOSIS — H903 Sensorineural hearing loss, bilateral: Secondary | ICD-10-CM | POA: Insufficient documentation

## 2023-04-10 DIAGNOSIS — H9311 Tinnitus, right ear: Secondary | ICD-10-CM | POA: Insufficient documentation

## 2023-04-10 NOTE — Progress Notes (Signed)
[facility], [facility]  [address]  [place] [address]  Phone: [phone]  Fax: [phone]      Encounter Date: 04/10/2023    Patient ID: Scott Bradshaw  MRN: [id number]    DOB: May 20, 1960  Age: 62 y.o. male     Progress Note       Referring Provider:  Saralyn Pilar, FNP    Reason for Visit:   Chief Complaint   Patient presents with    Follow-up After Testing     Rc after MRI. No new complaints         History of Present Illness:  Scott Bradshaw is a 62 y.o. male follow up asymmetric SNHL.  MRI brain completed.  He declined treatment with prednisone taper.    02/07/23  History of Present Illness:  Scott Bradshaw is a 62 y.o. male referred for hearing loss.  He reports right ear fullness, hearing loss, and tinnitus for 6-8 weeks.  No trauma to ear.  Denies sinus/allergy complaints.      Patient History:  There is no problem list on file for this patient.    Current Outpatient Medications   Medication Sig    albuterol sulfate (PROVENTIL OR VENTOLIN OR PROAIR) 90 mcg/actuation Inhalation oral inhaler Take 2 Puffs by inhalation Every 4 hours as needed    aspirin (ECOTRIN) 81 mg Oral Tablet, Delayed Release (E.C.) Take 1 Tablet (81 mg total) by mouth Once a day    atenoloL (TENORMIN) 25 mg Oral Tablet Take 1 Tablet (25 mg total) by mouth Once a day    clonazePAM (KLONOPIN) 0.5 mg Oral Tablet Take 1 Tablet (0.5 mg total) by mouth Once per day as needed    ergocalciferol, vitamin D2, (DRISDOL) 1,250 mcg (50,000 unit) Oral Capsule Take 1 Capsule (50,000 Units total) by mouth Every 7 days    esomeprazole magnesium (NEXIUM) 40 mg Oral Capsule, Delayed Release(E.C.) Take 1 Capsule (40 mg total) by mouth Every morning before breakfast    fluticasone-umeclidin-vilanter 200-62.5-25 mcg Inhalation Disk with Device Take 1 INHALATION  by inhalation Once a day    hydrOXYzine pamoate (VISTARIL) 25 mg Oral Capsule Take 1 Capsule (25 mg total) by mouth Three times a day as needed    Ibuprofen (MOTRIN) 800 mg Oral Tablet  Take 1 Tablet (800 mg total) by mouth Three times a day as needed for Pain    ipratropium-albuterol 0.5 mg-3 mg(2.5 mg base)/3 mL Solution for Nebulization Take 3 mL by nebulization Four times a day as needed    lidocaine 4 % Adhesive Patch, Medicated Place 1 Patch on the skin Once a day    loratadine (CLARITIN) 10 mg Oral Tablet Take 1 Tablet (10 mg total) by mouth Once a day    montelukast (SINGULAIR) 10 mg Oral Tablet Take 1 Tablet (10 mg total) by mouth Once a day    potassium chloride (K-DUR) 20 mEq Oral Tab Sust.Rel. Particle/Crystal Take 1 Tablet (20 mEq total) by mouth Once a day    testosterone 1.62 % (20.25 mg/1.25 gram) Transdermal Gel in Packet Place 2 Sprays on the skin Once a day    tirzepatide (MOUNJARO) 7.5 mg/0.5 mL Subcutaneous Pen Injector 0.5 mL (7.5 mg total)    tiZANidine (ZANAFLEX) 4 mg Oral Tablet Take 1 Tablet (4 mg total) by mouth Three times a day      Allergies   Allergen Reactions    Penicillins Hives/ Urticaria     Past Medical History:   Diagnosis  Date    Anxiety     Asthma     Cardiac arrhythmia     Depression     Diabetes mellitus, type 2 (CMS HCC)     DOE (dyspnea on exertion)     Esophageal reflux     Hypercholesterolemia     Hypertension     Palpitation     Sarcoidosis     Ulcerative colitis (CMS HCC)      Past Surgical History:   Procedure Laterality Date    COLONOSCOPY      HX HERNIA REPAIR      HX KNEE ARTHROSCOPY Right     HX VASECTOMY      UMBILICAL HERNIA REPAIR       Family Medical History:       Problem Relation (Age of Onset)    Bone cancer Mother    Heart Disease Father    Lung Cancer Mother    Prostate Cancer Father    Thyroid Cancer Mother            Social History     Tobacco Use    Smoking status: Never    Smokeless tobacco: Current     Types: Chew, Snuff   Vaping Use    Vaping status: Never Used   Substance Use Topics    Alcohol use: Yes     Alcohol/week: 12.0 standard drinks of alcohol     Types: 12 Cans of beer per week     Comment: socially    Drug use: Never        Review of Systems     Vitals:    04/10/23 1414   Weight: 109 kg (241 lb)   Height: 1.829 m (6')   BMI: 32.69      ENT Physical Exam  Constitutional  Appearance: patient appears well-developed and well-groomed, obesity noted,  Communication/Voice: communication appropriate for developmental age; vocal quality normal;  Head and Face  Appearance: head appears normal, face appears normal and face appears atraumatic;  Palpation: facial palpation normal;  Salivary: glands normal;  Ear  Hearing: intact;  Auricles: right auricle normal; left auricle normal;  External Mastoids: right external mastoid normal; left external mastoid normal;  Ear Canals: right ear canal normal; left ear canal normal;  Tympanic Membranes: right tympanic membrane normal; left tympanic membrane normal;  Nose  External Nose: nares patent bilaterally; external nose normal;  Internal Nose: nasal mucosa normal; septum normal; bilateral inferior turbinates normal;  Oral Cavity/Oropharynx  Lips: normal;  Teeth: normal;  Gums: gingiva normal;  Tongue: normal;  Oral mucosa: normal;  Hard palate: normal;  Soft palate: normal;  Tonsils: normal;  Base of Tongue: normal;  Posterior pharyngeal wall: normal;  Neck  Neck: neck normal; neck palpation normal;  Thyroid: thyroid normal;  Respiratory  Inspection: breathing unlabored; normal breathing rate;  Lymphatic  Palpation: lymph nodes normal;  Neurovestibular  Mental Status: alert and oriented;  Psychiatric: mood normal; affect is appropriate;  Cranial Nerves: cranial nerves intact;       Assessment:  ENCOUNTER DIAGNOSES     ICD-10-CM   1. Asymmetrical sensorineural hearing loss  H90.3   2. Tinnitus of right ear  H93.11       Plan:  Medical records reviewed on 04/10/2023.  MRI brain [facility] shows no etiology for asymmetric SNHL.  Will repeat audiogram 6 months from last test.      Orders Placed This Encounter    AMB PRN REFERRAL EXTERNAL AUDIOLOGIST  Return for after repeat audio.    Elnora Morrison, FNP-BC  04/10/2023, 13:17

## 2023-04-17 ENCOUNTER — Ambulatory Visit (INDEPENDENT_AMBULATORY_CARE_PROVIDER_SITE_OTHER): Payer: 59 | Admitting: NURSE PRACTITIONER

## 2023-08-06 ENCOUNTER — Other Ambulatory Visit: Payer: Self-pay

## 2023-08-06 ENCOUNTER — Ambulatory Visit: Payer: Self-pay | Attending: NURSE PRACTITIONER | Admitting: NURSE PRACTITIONER

## 2023-08-06 ENCOUNTER — Encounter (INDEPENDENT_AMBULATORY_CARE_PROVIDER_SITE_OTHER): Payer: Self-pay | Admitting: NURSE PRACTITIONER

## 2023-08-06 VITALS — Ht 72.0 in | Wt 237.0 lb

## 2023-08-06 DIAGNOSIS — H903 Sensorineural hearing loss, bilateral: Secondary | ICD-10-CM | POA: Insufficient documentation

## 2023-08-06 DIAGNOSIS — H9311 Tinnitus, right ear: Secondary | ICD-10-CM | POA: Insufficient documentation

## 2023-08-06 NOTE — Progress Notes (Signed)
 [facility], [facility]  [address]  [place] [address]  Phone: [phone]  Fax: [phone]      Encounter Date: 08/06/2023    Patient ID: Scott Bradshaw  MRN: [id number]    DOB: 04-Jun-1960  Age: 63 y.o. male     Progress Note       Referring Provider:  Kurtis Philips, FNP    Reason for Visit:   Chief Complaint   Patient presents with    Follow-up After Testing     Rc after audio. No new complaints         History of Present Illness:  Scott Bradshaw is a 63 y.o. male follow up asymmetric SNHL right ear and tinnitus.  No CP tumor on previous MRI.  Repeat audio completed      Patient History:  There is no problem list on file for this patient.    Current Outpatient Medications   Medication Sig    albuterol  sulfate (PROVENTIL  OR VENTOLIN  OR PROAIR ) 90 mcg/actuation Inhalation oral inhaler Take 2 Puffs by inhalation Every 4 hours as needed    aspirin  (ECOTRIN) 81 mg Oral Tablet, Delayed Release (E.C.) Take 1 Tablet (81 mg total) by mouth Daily    atenoloL (TENORMIN) 25 mg Oral Tablet Take 1 Tablet (25 mg total) by mouth Daily    atorvastatin (LIPITOR) 20 mg Oral Tablet     clonazePAM (KLONOPIN) 0.5 mg Oral Tablet Take 1 Tablet (0.5 mg total) by mouth Once per day as needed    ergocalciferol, vitamin D2, (DRISDOL) 1,250 mcg (50,000 unit) Oral Capsule Take 1 Capsule (50,000 Units total) by mouth Every 7 days    esomeprazole magnesium (NEXIUM) 40 mg Oral Capsule, Delayed Release(E.C.) Take 1 Capsule (40 mg total) by mouth Every morning before breakfast    fluticasone propionate (FLONASE) 50 mcg/actuation Nasal Spray, Suspension NO ORIGINAL SIG    fluticasone-umeclidin-vilanter 200-62.5-25 mcg Inhalation Disk with Device Take 1 INHALATION  by inhalation Once a day    hydrOXYzine pamoate (VISTARIL) 25 mg Oral Capsule Take 1 Capsule (25 mg total) by mouth Three times a day as needed    Ibuprofen (MOTRIN) 800 mg Oral Tablet Take 1 Tablet (800 mg total) by mouth Three times a day as needed for Pain     ipratropium-albuterol  0.5 mg-3 mg(2.5 mg base)/3 mL Solution for Nebulization Take 3 mL by nebulization Four times a day as needed    lidocaine  4 % Adhesive Patch, Medicated Place 1 Patch on the skin Once a day    montelukast (SINGULAIR) 10 mg Oral Tablet Take 1 Tablet (10 mg total) by mouth Daily    MOUNJARO 5 mg/0.5 mL Subcutaneous Pen Injector     potassium chloride (K-DUR) 20 mEq Oral Tab Sust.Rel. Particle/Crystal Take 1 Tablet (20 mEq total) by mouth Daily    sertraline (ZOLOFT) 100 mg Oral Tablet     testosterone 1.62 % (20.25 mg/1.25 gram) Transdermal Gel in Packet Place 2 Sprays on the skin Once a day    tiZANidine  (ZANAFLEX ) 4 mg Oral Tablet Take 1 Tablet (4 mg total) by mouth Three times a day    torsemide (DEMADEX) 10 mg Oral Tablet     vilazodone (VIIBRYD) 10 mg Oral Tablet       Allergies   Allergen Reactions    Penicillins Hives/ Urticaria     Past Medical History:   Diagnosis Date    Anxiety     Asthma     Cardiac arrhythmia  Depression     Diabetes mellitus, type 2     DOE (dyspnea on exertion)     Esophageal reflux     Hypercholesterolemia     Hypertension     Palpitation     Sarcoidosis     Ulcerative colitis      Past Surgical History:   Procedure Laterality Date    COLONOSCOPY      HX HERNIA REPAIR      HX KNEE ARTHROSCOPY Right     HX VASECTOMY      UMBILICAL HERNIA REPAIR       Family Medical History:       Problem Relation (Age of Onset)    Bone cancer Mother    Heart Disease Father    Lung Cancer Mother    Prostate Cancer Father    Thyroid Cancer Mother            Social History     Tobacco Use    Smoking status: Never    Smokeless tobacco: Current     Types: Chew, Snuff   Vaping Use    Vaping status: Never Used   Substance Use Topics    Alcohol use: Yes     Alcohol/week: 12.0 standard drinks of alcohol     Types: 12 Cans of beer per week     Comment: socially    Drug use: Never       Review of Systems     Vitals:    08/06/23 1528   Weight: 108 kg (237 lb)   Height: 1.829 m (6')   BMI:  32.14      ENT Physical Exam  Constitutional  Appearance: patient appears well-developed, well-nourished and well-groomed,  Communication/Voice: communication appropriate for developmental age; vocal quality normal;  Head and Face  Appearance: head appears normal, face appears normal and face appears atraumatic;  Palpation: facial palpation normal;  Salivary: glands normal;  Ear  Hearing: intact;  Auricles: right auricle normal; left auricle normal;  External Mastoids: right external mastoid normal; left external mastoid normal;  Ear Canals: right ear canal normal; left ear canal normal;  Tympanic Membranes: right tympanic membrane normal; left tympanic membrane normal;  Nose  External Nose: nares patent bilaterally; external nose normal;  Internal Nose: nasal mucosa normal; septum normal; bilateral inferior turbinates normal;  Oral Cavity/Oropharynx  Lips: normal;  Teeth: normal;  Gums: gingiva normal;  Tongue: normal;  Oral mucosa: normal;  Hard palate: normal;  Soft palate: normal;  Tonsils: normal;  Base of Tongue: normal;  Posterior pharyngeal wall: normal;  Neck  Neck: neck normal; neck palpation normal;  Thyroid: thyroid normal;  Respiratory  Inspection: breathing unlabored; normal breathing rate;  Lymphatic  Palpation: lymph nodes normal;  Neurovestibular  Mental Status: alert and oriented;  Psychiatric: mood normal; affect is appropriate;  Cranial Nerves: cranial nerves intact;       Assessment:  ENCOUNTER DIAGNOSES     ICD-10-CM   1. Asymmetrical sensorineural hearing loss  H90.3   2. Tinnitus of right ear  H93.11       Plan:  Medical records reviewed on 08/06/2023.  Reviewed audiogram from [facility].  Hearing is unchanged, still with asymmetric SNHL right ear.    Repeat audio in 1 year            Orders Placed This Encounter    AMB PRN REFERRAL EXTERNAL AUDIOLOGIST     No follow-ups on file.    Junette Older, FNP-BC  08/06/2023, 15:36

## 2024-08-10 ENCOUNTER — Ambulatory Visit (INDEPENDENT_AMBULATORY_CARE_PROVIDER_SITE_OTHER): Payer: Self-pay | Admitting: NURSE PRACTITIONER
